# Patient Record
Sex: Female | Born: 1945 | Race: White | Hispanic: No | Marital: Married | Smoking: Never smoker
Health system: Southern US, Community
[De-identification: ages and names within clinical notes are randomized; demographics above are authoritative.]

## PROBLEM LIST (undated history)

## (undated) DIAGNOSIS — Z9889 Other specified postprocedural states: Secondary | ICD-10-CM

## (undated) DIAGNOSIS — E079 Disorder of thyroid, unspecified: Secondary | ICD-10-CM

## (undated) DIAGNOSIS — E039 Hypothyroidism, unspecified: Secondary | ICD-10-CM

## (undated) DIAGNOSIS — R112 Nausea with vomiting, unspecified: Secondary | ICD-10-CM

## (undated) HISTORY — PX: OTHER SURGICAL HISTORY: SHX169

## (undated) HISTORY — PX: ABDOMINAL HYSTERECTOMY: SHX81

## (undated) HISTORY — PX: APPENDECTOMY: SHX54

## (undated) HISTORY — PX: CHOLECYSTECTOMY: SHX55

---

## 2006-12-08 ENCOUNTER — Ambulatory Visit: Payer: Self-pay | Admitting: Otolaryngology

## 2006-12-30 ENCOUNTER — Ambulatory Visit: Payer: Self-pay | Admitting: Ophthalmology

## 2007-02-24 ENCOUNTER — Ambulatory Visit: Payer: Self-pay | Admitting: Ophthalmology

## 2017-11-06 LAB — LIPID PANEL
Cholesterol: 236 — AB (ref 0–200)
HDL: 66 (ref 35–70)
LDL Cholesterol: 143
LDl/HDL Ratio: 3.6
Triglycerides: 144 (ref 40–160)

## 2017-11-06 LAB — COMPREHENSIVE METABOLIC PANEL: GFR calc non Af Amer: 60

## 2017-11-06 LAB — BASIC METABOLIC PANEL: Creatinine: 0.9 (ref <=1.1)

## 2017-11-23 LAB — HM MAMMOGRAPHY

## 2017-12-07 LAB — HM DEXA SCAN

## 2019-12-27 ENCOUNTER — Telehealth: Payer: Self-pay

## 2019-12-27 ENCOUNTER — Other Ambulatory Visit: Payer: Self-pay

## 2019-12-27 ENCOUNTER — Ambulatory Visit (INDEPENDENT_AMBULATORY_CARE_PROVIDER_SITE_OTHER): Payer: Medicare PPO | Admitting: Family Medicine

## 2019-12-27 ENCOUNTER — Encounter: Payer: Self-pay | Admitting: Family Medicine

## 2019-12-27 VITALS — BP 150/60 | HR 71 | Temp 97.0°F | Ht 63.25 in | Wt 184.5 lb

## 2019-12-27 DIAGNOSIS — E039 Hypothyroidism, unspecified: Secondary | ICD-10-CM | POA: Diagnosis not present

## 2019-12-27 DIAGNOSIS — M791 Myalgia, unspecified site: Secondary | ICD-10-CM | POA: Diagnosis not present

## 2019-12-27 DIAGNOSIS — E782 Mixed hyperlipidemia: Secondary | ICD-10-CM

## 2019-12-27 DIAGNOSIS — M170 Bilateral primary osteoarthritis of knee: Secondary | ICD-10-CM

## 2019-12-27 DIAGNOSIS — R030 Elevated blood-pressure reading, without diagnosis of hypertension: Secondary | ICD-10-CM | POA: Diagnosis not present

## 2019-12-27 DIAGNOSIS — E785 Hyperlipidemia, unspecified: Secondary | ICD-10-CM | POA: Insufficient documentation

## 2019-12-27 DIAGNOSIS — T466X5A Adverse effect of antihyperlipidemic and antiarteriosclerotic drugs, initial encounter: Secondary | ICD-10-CM

## 2019-12-27 DIAGNOSIS — H93291 Other abnormal auditory perceptions, right ear: Secondary | ICD-10-CM

## 2019-12-27 NOTE — Assessment & Plan Note (Signed)
Hx of hylaurinc acid injections and would be interested in repeat if able. Encouraged tumeric and PT - she will meet with Dr. Patsy Lager and discuss with him. Requesting ortho records

## 2019-12-27 NOTE — Assessment & Plan Note (Addendum)
Feels weight issues may be related to thyroid but reports normal labs and due for annual level. Cont synthroid for now

## 2019-12-27 NOTE — Telephone Encounter (Signed)
[facility] Night - Client Nonclinical Telephone Record AccessNurse Client  Primary Care [facility] Night - Client Client Site  [facility] - Night Contact Type Call Who Is Calling Patient / Member / Family / Caregiver Caller Name Kelle Ruppert Caller Phone Number [phone] Patient Name Leily Capek Patient DOB 1945/09/26 Call Type Message Only Information Provided Reason for Call Request for General Office Information Initial Comment Caller states they are calling to confirm their appt. Additional Comment Disp. Time Disposition Final User 12/26/2019 5:38:51 PM General Information Provided Yes Lynnell Grain Call Closed By: Lynnell Grain Transaction Date/Time: 12/26/2019 5:36:57 PM (ET)

## 2019-12-27 NOTE — Assessment & Plan Note (Signed)
Pt notes hearing an echo and decreased hearing as well as occasional wind blowing sensation. Recommended to see ENT but never went. Encouraged follow-up for further evaluation. Referral placed

## 2019-12-27 NOTE — Progress Notes (Signed)
Subjective:     Amber Chavez is a 74 y.o. 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  #right ear - will get an echo sensation or wind blowing sensation - went to a audiologist - and told she had some hearing loss - and referred to the ENT   #hypothyroidism - always feels like it should be higher - and her dose was lowered previously - weight is the reason she thinks its off  Review of Systems   Social History   Tobacco Use  Smoking Status Never Smoker  Smokeless Tobacco Never Used        Objective:    BP Readings from Last 3 Encounters:  12/27/19 (!) 150/60   Wt Readings from Last 3 Encounters:  12/27/19 184 lb 8 oz (83.7 kg)    BP (!) 150/60   Pulse 71   Temp (!) 97 F (36.1 C) (Temporal)   Ht 5' 3.25" (1.607 m)   Wt 184 lb 8 oz (83.7 kg)   SpO2 96%   BMI 32.42 kg/m    Physical Exam Constitutional:      General: She is not in acute distress.    Appearance: She is well-developed. She is not diaphoretic.  HENT:     Right Ear: External ear normal.     Left Ear: External ear normal.     Nose: Nose normal.  Eyes:     Conjunctiva/sclera: Conjunctivae normal.  Cardiovascular:     Rate and Rhythm: Normal rate and regular rhythm.     Heart sounds: No murmur heard.   Pulmonary:     Effort: Pulmonary effort is normal. No respiratory distress.     Breath sounds: Normal breath sounds. No wheezing.  Musculoskeletal:     Cervical back: Neck supple.  Skin:    General: Skin is warm and dry.     Capillary Refill: Capillary refill takes less than 2 seconds.  Neurological:     Mental Status: She is alert. Mental status is at baseline.  Psychiatric:        Mood and  Affect: Mood normal.        Behavior: Behavior normal.           Assessment & Plan:   Problem List Items Addressed This Visit      Endocrine   Hypothyroidism    Feels weight issues may be related to thyroid but reports normal labs and due for annual level. Cont synthroid for now      Relevant Medications   levothyroxine (SYNTHROID) 88 MCG tablet   Other Relevant Orders   TSH   Comprehensive metabolic panel     Nervous and Auditory   Abnormal auditory perception of right ear    Pt notes hearing an echo and decreased hearing as well as occasional wind blowing sensation. Recommended to see ENT but never went. Encouraged follow-up for further evaluation. Referral placed      Relevant Orders   Ambulatory referral to ENT     Musculoskeletal and Integument   Osteoarthritis of both knees - Primary    Hx of hylaurinc acid injections and would be interested in repeat if  able. Encouraged tumeric and PT - she will meet with Dr. Patsy Lager and discuss with him. Requesting ortho records        Other   Myalgia due to statin    Statin induced muscle pain so declines      Hyperlipidemia    Recheck cholesterol. Encourage exercise and healthy diet      Relevant Orders   Lipid panel    Other Visit Diagnoses    Elevated blood pressure reading       Relevant Orders   CBC       Return in about 3 months (around 03/28/2020) for blood pressure.  Lynnda Child, MD  This visit occurred during the SARS-CoV-2 public health emergency.  Safety protocols were in place, including screening questions prior to the visit, additional usage of staff PPE, and extensive cleaning of exam room while observing appropriate contact time as indicated for disinfecting solutions.

## 2019-12-27 NOTE — Assessment & Plan Note (Signed)
Recheck cholesterol. Encourage exercise and healthy diet

## 2019-12-27 NOTE — Assessment & Plan Note (Signed)
Statin induced muscle pain so declines

## 2019-12-27 NOTE — Patient Instructions (Addendum)
Make appointment to see Dr. Patsy Lager for knee injection and care for knees   Curcumin or Tumeric  Research has shown that Curcumin (Tumeric) supplements are just as good as high dose anti-inflammatory medications (like diclofenac and ibuprofen) at treating pain from osteoarthritis without the side effects.   Take Curcumin 500 mg Three times daily   -- You can find a bottle at [employer] for $8 which should last a month -- [place] [employer] is around $9 and is available at [facility]  #Referral I have placed a referral to a specialist for you. You should receive a phone call from the specialty office. Make sure your voicemail is not full and that if you are able to answer your phone to unknown or new numbers.   It may take up to 2 weeks to hear about the referral. If you do not hear anything in 2 weeks, please call our office and ask to speak with the referral coordinator.   Your blood pressure high.   High blood pressure increases your risk for heart attack and stroke.    Please check your blood pressure 2-4 times a week.   To check your blood pressure 1) Sit in a quiet and relaxed place for 5 minutes 2) Make sure your feet are flat on the ground 3) Consider checking first thing in the morning   Normal blood pressure is less than 140/90 Ideally you blood pressure should be around 120/80  Other ways you can reduce your blood pressure:  1) Regular exercise -- Try to get 150 minutes (30 minutes, 5 days a week) of moderate to vigorous aerobic excercise -- Examples: brisk walking (2.5 miles per hour), water aerobics, dancing, gardening, tennis, biking slower than 10 miles per hour 2) DASH Diet - low fat meats, more fresh fruits and vegetables, whole grains, low salt 3) Quit smoking if you smoke 4) Loose 5-10% of your body weight

## 2019-12-28 ENCOUNTER — Other Ambulatory Visit: Payer: Self-pay | Admitting: Family Medicine

## 2019-12-28 ENCOUNTER — Telehealth: Payer: Self-pay

## 2019-12-28 DIAGNOSIS — E039 Hypothyroidism, unspecified: Secondary | ICD-10-CM

## 2019-12-28 DIAGNOSIS — H93291 Other abnormal auditory perceptions, right ear: Secondary | ICD-10-CM

## 2019-12-28 LAB — COMPREHENSIVE METABOLIC PANEL
ALT: 13 U/L (ref 0–35)
AST: 18 U/L (ref 0–37)
Albumin: 4.5 g/dL (ref 3.5–5.2)
Alkaline Phosphatase: 81 U/L (ref 39–117)
BUN: 18 mg/dL (ref 6–23)
CO2: 28 mEq/L (ref 19–32)
Calcium: 10 mg/dL (ref 8.4–10.5)
Chloride: 103 mEq/L (ref 96–112)
Creatinine, Ser: 0.9 mg/dL (ref 0.40–1.20)
GFR: 60.8 mL/min (ref >60.00)
Glucose, Bld: 106 mg/dL — ABNORMAL HIGH (ref 70–99)
Potassium: 4.3 mEq/L (ref 3.5–5.1)
Sodium: 140 mEq/L (ref 135–145)
Total Bilirubin: 0.5 mg/dL (ref 0.2–1.2)
Total Protein: 6.9 g/dL (ref 6.0–8.3)

## 2019-12-28 LAB — CBC
HCT: 39.4 % (ref 36.0–46.0)
Hemoglobin: 13.4 g/dL (ref 12.0–15.0)
MCHC: 34 g/dL (ref 30.0–36.0)
MCV: 89.7 fl (ref 78.0–100.0)
Platelets: 286 10*3/uL (ref 150.0–400.0)
RBC: 4.39 Mil/uL (ref 3.87–5.11)
RDW: 14.3 % (ref 11.5–15.5)
WBC: 4.4 10*3/uL (ref 4.0–10.5)

## 2019-12-28 LAB — LIPID PANEL
Cholesterol: 253 mg/dL — ABNORMAL HIGH (ref 0–200)
HDL: 74.8 mg/dL (ref >39.00)
LDL Cholesterol: 143 mg/dL — ABNORMAL HIGH (ref 0–99)
NonHDL: 178.38
Total CHOL/HDL Ratio: 3
Triglycerides: 175 mg/dL — ABNORMAL HIGH (ref 0.0–149.0)
VLDL: 35 mg/dL (ref 0.0–40.0)

## 2019-12-28 LAB — TSH: TSH: 0.92 u[IU]/mL (ref 0.35–4.50)

## 2019-12-28 MED ORDER — LEVOTHYROXINE SODIUM 88 MCG PO TABS: 88.0000 ug | ORAL_TABLET | Freq: Every day | ORAL | 3 refills | Status: DC

## 2019-12-28 NOTE — Telephone Encounter (Signed)
I spoke to pt and relayed lab results, per Dr. Selena Batten. Refill of Synthroid, sent to [facility] by Dr. Selena Batten, was canceled and resubmitted to [facility], per pt's request.

## 2019-12-29 ENCOUNTER — Encounter: Payer: Self-pay | Admitting: Family Medicine

## 2019-12-29 ENCOUNTER — Telehealth: Payer: Self-pay | Admitting: Family Medicine

## 2019-12-29 DIAGNOSIS — M810 Age-related osteoporosis without current pathological fracture: Secondary | ICD-10-CM | POA: Insufficient documentation

## 2019-12-29 NOTE — Telephone Encounter (Signed)
Recv'd records from [facility] forwarded 46 pages to Dr. Gweneth Dimitri 7/15/21fbg

## 2020-01-02 ENCOUNTER — Encounter: Payer: Self-pay | Admitting: Family Medicine

## 2020-01-26 ENCOUNTER — Encounter: Payer: Self-pay | Admitting: Family Medicine

## 2020-01-26 ENCOUNTER — Ambulatory Visit (INDEPENDENT_AMBULATORY_CARE_PROVIDER_SITE_OTHER)
Admission: RE | Admit: 2020-01-26 | Discharge: 2020-01-26 | Disposition: A | Discharge status: Home / Self Care | Payer: Medicare PPO | Source: Ambulatory Visit | Attending: Family Medicine | Admitting: Family Medicine

## 2020-01-26 ENCOUNTER — Other Ambulatory Visit: Payer: Self-pay

## 2020-01-26 ENCOUNTER — Ambulatory Visit: Payer: Medicare PPO | Admitting: Family Medicine

## 2020-01-26 VITALS — BP 124/68 | HR 78 | Temp 96.8°F | Ht 63.25 in

## 2020-01-26 DIAGNOSIS — M25562 Pain in left knee: Secondary | ICD-10-CM

## 2020-01-26 DIAGNOSIS — G8929 Other chronic pain: Secondary | ICD-10-CM

## 2020-01-26 DIAGNOSIS — M1712 Unilateral primary osteoarthritis, left knee: Secondary | ICD-10-CM
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
## 2020-01-26 NOTE — Patient Instructions (Signed)
Bauerfeind Genutrain Knee brace

## 2020-01-29 ENCOUNTER — Encounter: Payer: Self-pay | Admitting: Family Medicine

## 2020-02-02 ENCOUNTER — Encounter: Payer: Self-pay | Admitting: Family Medicine

## 2020-03-29 ENCOUNTER — Ambulatory Visit (INDEPENDENT_AMBULATORY_CARE_PROVIDER_SITE_OTHER): Payer: Medicare PPO | Admitting: Family Medicine

## 2020-03-29 ENCOUNTER — Other Ambulatory Visit: Payer: Self-pay

## 2020-03-29 VITALS — BP 120/58 | HR 72 | Temp 97.2°F | Ht 63.0 in | Wt 184.2 lb

## 2020-03-29 DIAGNOSIS — M810 Age-related osteoporosis without current pathological fracture: Secondary | ICD-10-CM

## 2020-03-29 DIAGNOSIS — Z0000 Encounter for general adult medical examination without abnormal findings: Secondary | ICD-10-CM | POA: Diagnosis not present

## 2020-03-29 MED ORDER — BIMATOPROST 0.03 % EX SOLN: 5.0000 mL | Freq: Every day | CUTANEOUS | 0 refills | Status: DC

## 2020-03-29 NOTE — Progress Notes (Signed)
Subjective:   Amber Chavez is a 74 y.o. female who presents for Medicare Annual (Subsequent) preventive examination.  Review of Systems    ROS Cardiac Risk Factors include: advanced age (>25men, >28 women)     Objective:    Today's Vitals   03/29/20 0939  BP: (!) 120/58  Pulse: 72  Temp: (!) 97.2 F (36.2 C)  TempSrc: Temporal  SpO2: 97%  Weight: 184 lb 4 oz (83.6 kg)  Height: 5\' 3"  (1.6 m)   Body mass index is 32.64 kg/m.  Advanced Directives 03/29/2020  Does Patient Have a Medical Advance Directive? Yes  Type of Advance Directive Living will;Healthcare Power of Attorney  Does patient want to make changes to medical advance directive? No - Patient declined  Copy of Healthcare Power of Attorney in Chart? No - copy requested    Current Medications (verified) Outpatient Encounter Medications as of 03/29/2020  Medication Sig  . bimatoprost (LATISSE) 0.03 % ophthalmic solution Place 5 mLs into both eyes at bedtime. Place one drop on applicator and apply evenly along the skin of the upper eyelid at base of eyelashes once daily at bedtime; repeat procedure for second eye (use a clean applicator).  03/31/2020 levothyroxine (SYNTHROID) 88 MCG tablet Take 1 tablet (88 mcg total) by mouth daily.  . [DISCONTINUED] bimatoprost (LATISSE) 0.03 % ophthalmic solution Place into both eyes at bedtime. Place one drop on applicator and apply evenly along the skin of the upper eyelid at base of eyelashes once daily at bedtime; repeat procedure for second eye (use a clean applicator).   No facility-administered encounter medications on file as of 03/29/2020.    Allergies (verified) Codeine and Demerol [meperidine hcl]   History: No past medical history on file. Past Surgical History:  Procedure Laterality Date  . ABDOMINAL HYSTERECTOMY    . APPENDECTOMY    . CHOLECYSTECTOMY    . right foot surgery     bone removed, then revision   Family History  Problem Relation Age of Onset  .  Healthy Mother   . Dilated cardiomyopathy Father   . Dilated cardiomyopathy Sister   . Dilated cardiomyopathy Paternal Aunt   . Kidney cancer Sister    Social History   Socioeconomic History  . Marital status: Married    Spouse name: 03/31/2020  . Number of children: 4  . Years of education: high school  . Highest education level: Not on file  Occupational History  . Not on file  Tobacco Use  . Smoking status: Never Smoker  . Smokeless tobacco: Never Used  Vaping Use  . Vaping Use: Never used  Substance and Sexual Activity  . Alcohol use: Yes    Comment: very seldomly   . Drug use: Never  . Sexual activity: Yes    Birth control/protection: Surgical  Other Topics Concern  . 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 Difficulty of Paying Living Expenses: Not on file  Food Insecurity:   . Worried About Programme researcher, broadcasting/film/video in the Last Year: Not on file  . Ran Out of Food in the Last Year: Not on file  Transportation Needs:   . Lack of Transportation (Medical): Not on file  . Lack of Transportation (Non-Medical): Not on file  Physical Activity:   . Days of Exercise per Week: Not on file  . Minutes of Exercise per Session: Not on file  Stress:   . Feeling of Stress : Not on file  Social Connections:   . Frequency of Communication with Friends and Family: Not on file  . Frequency  of Social Gatherings with Friends and Family: Not on file  . Attends Religious Services: Not on file  . Active Member of Clubs or Organizations: Not on file  . Attends Banker Meetings: Not on file  . Marital Status: Not on file    Tobacco Counseling Counseling given: Not Answered   Clinical Intake:  Pre-visit preparation completed: Yes  Pain : No/denies pain     Nutritional Status: BMI > 30  Obese Nutritional Risks: None Diabetes: No  How often do you need to have someone help you when you read instructions, pamphlets, or other written materials from your doctor or pharmacy?: 1 - Never What is the last grade level you completed in school?: Some college  Diabetic? no  Interpreter Needed?: No      Activities of Daily Living In your present state of health, do you have any difficulty performing the following activities: 03/29/2020  Hearing? Y  Comment hearing aids  Vision? N  Difficulty concentrating or making decisions? N  Walking or climbing stairs? Y  Comment knee pain, has railings  Dressing or bathing? N  Doing errands, shopping? N  Preparing Food and eating ? N  Using the Toilet? N  In the past six months, have you accidently leaked urine? N  Do you have problems with loss of bowel control? N  Managing your Medications? N  Managing your Finances? N  Housekeeping or managing your Housekeeping? N  Some recent data might be hidden    Patient Care Team: Lynnda Child, MD as PCP - General (Family Medicine)  Indicate any recent Medical Services you may have received from other than [facility] providers in the past year (date may be approximate).     Assessment:   This is a routine wellness examination for [facility].  Hearing/Vision screen  Hearing Screening   125Hz  250Hz  500Hz  1000Hz  2000Hz  3000Hz  4000Hz  6000Hz  8000Hz   Right ear:           Left ear:           Comments: Pt wears hearing aides    Visual Acuity Screening   Right eye Left eye  Both eyes  Without correction: 20/30 20/25 20/20   With correction:       Dietary issues and exercise activities discussed: Current Exercise Habits: Home exercise routine, Type of exercise: walking, Intensity: Mild, Exercise limited by: orthopedic condition(s)  Goals    . Exercise 3x per week (30 min per time)     Getting Knee pain improved to be able to be more active      Depression Screen [facility] 2/9 Scores 03/29/2020 03/29/2020 12/27/2019  PHQ -  2 Score 0 0 0    Fall Risk Fall Risk  03/29/2020 03/29/2020 12/27/2019  Falls in the past year? 0 0 0  Number falls in past yr: 0 0 -  Injury with Fall? 0 - -  Risk for fall due to : Orthopedic patient - -  Follow up Falls evaluation completed - -    Cognitive Function:  Mini-Cog - 03/29/20 1006    Normal clock drawing test? no    How many words correct? 3          Discussed failed clock test. She did get numbers correct. Handout for memory and encouraged walking and cognitive games. She and her husband will monitor at home and we will do different memory test next year.   Immunizations Immunization History  Administered Date(s) Administered  . PFIZER SARS-COV-2 Vaccination 09/07/2019, 09/28/2019  . Tdap 06/16/2008    TDAP status: Due, Education has been provided regarding the importance of this vaccine. Advised may receive this vaccine at local pharmacy or Health Dept. Aware to provide a copy of the vaccination record if obtained from local pharmacy or Health Dept. Verbalized acceptance and understanding. Flu Vaccine status: Declined, Education has been provided regarding the importance of this vaccine but patient still declined. Advised may receive this vaccine at local pharmacy or Health Dept. Aware to provide a copy of the vaccination record if obtained from local pharmacy or Health Dept. Verbalized acceptance and understanding. Pneumococcal vaccine status: Up to date Covid-19 vaccine status: Completed vaccines  Qualifies for  Shingles Vaccine? Yes   Zostavax completed Yes   Shingrix Completed?: Yes  Screening Tests Health Maintenance  Topic Date Due  . Hepatitis C Screening  Never done  . TETANUS/TDAP  06/16/2018  . INFLUENZA VACCINE  09/13/2020 (Originally 01/15/2020)  . PNA vac Low Risk Adult (1 of 2 - PCV13) 03/29/2021 (Originally 07/12/2008)  . DEXA SCAN  Completed  . COVID-19 Vaccine  Completed    Health Maintenance  Health Maintenance Due  Topic Date Due  . Hepatitis C Screening  Never done  . TETANUS/TDAP  06/16/2018    Colorectal cancer screening: No longer required.  Mammogram status: No longer required.  Bone Density status: Completed 2019. Results reflect: Bone density results: OSTEOPOROSIS. Repeat every 2 years.  Lung Cancer Screening: (Low Dose CT Chest recommended if Age 10-80 years, 30 pack-year currently smoking OR have quit w/in 15years.) does not qualify.   Lung Cancer Screening Referral: n/a  Additional Screening:  Hepatitis C Screening: does qualify; Completed - not yet done, will get with next lab draw  Vision Screening: Recommended annual ophthalmology exams for early detection of glaucoma and other disorders of the eye.   Dental Screening: Recommended annual dental exams for proper oral hygiene  Community Resource Referral / Chronic Care Management: CRR required this visit?  No   CCM required this visit?  No      Plan:      Problem List Items Addressed This Visit      Musculoskeletal and Integument   Osteoporosis    Discussed with patient. She is taking Vit D and Calcium and is not interested in additional treatment at this time. Will consider f/u Dexa next year if she thinks she may want treatment if worsening. Continue weight bearing activity.        Other Visit Diagnoses    Encounter for Medicare annual wellness exam    -  Primary       I have personally reviewed and  noted the following in the patient's chart:   . Medical and social history . Use of  alcohol, tobacco or illicit drugs  . Current medications and supplements . Functional ability and status . Nutritional status . Physical activity . Advanced directives . List of other physicians . Hospitalizations, surgeries, and ER visits in previous 12 months . Vitals . Screenings to include cognitive, depression, and falls . Referrals and appointments  In addition, I have reviewed and discussed with patient certain preventive protocols, quality metrics, and best practice recommendations. A written personalized care plan for preventive services as well as general preventive health recommendations were provided to patient.     Lynnda Child, MD   03/29/2020

## 2020-03-29 NOTE — Patient Instructions (Addendum)
Go to the pharmacy for Tetanus    Memory Compensation Strategies  Try memory games - crossword puzzles  Walking or regular exercise   1. Use "WARM" strategy.  W= write it down  A= associate it  R= repeat it  M= make a mental note  2.   You can keep a Glass blower/designer.  Use a 3-ring notebook with sections for the following: calendar, important names and phone numbers,  medications, doctors' names/phone numbers, lists/reminders, and a section to journal what you did  each day.   3.    Use a calendar to write appointments down.  4.    Write yourself a schedule for the day.  This can be placed on the calendar or in a separate section of the Memory Notebook.  Keeping a  regular schedule can help memory.  5.    Use medication organizer with sections for each day or morning/evening pills.  You may need help loading it  6.    Keep a basket, or pegboard by the door.  Place items that you need to take out with you in the basket or on the pegboard.  You may also want to  include a message board for reminders.  7.    Use sticky notes.  Place sticky notes with reminders in a place where the task is performed.  For example: " turn off the  stove" placed by the stove, "lock the door" placed on the door at eye level, " take your medications" on  the bathroom mirror or by the place where you normally take your medications.  8.    Use alarms/timers.  Use while cooking to remind yourself to check on food or as a reminder to take your medicine, or as a  reminder to make a call, or as a reminder to perform another task, etc.

## 2020-03-29 NOTE — Assessment & Plan Note (Signed)
Discussed with patient. She is taking Vit D and Calcium and is not interested in additional treatment at this time. Will consider f/u Dexa next year if she thinks she may want treatment if worsening. Continue weight bearing activity.

## 2020-04-17 NOTE — Telephone Encounter (Signed)
Pt seen 12/27/19.

## 2020-04-24 DIAGNOSIS — M170 Bilateral primary osteoarthritis of knee: Secondary | ICD-10-CM | POA: Diagnosis not present

## 2020-05-09 DIAGNOSIS — M170 Bilateral primary osteoarthritis of knee: Secondary | ICD-10-CM | POA: Diagnosis not present

## 2020-05-16 DIAGNOSIS — M170 Bilateral primary osteoarthritis of knee: Secondary | ICD-10-CM | POA: Diagnosis not present

## 2020-05-23 DIAGNOSIS — M170 Bilateral primary osteoarthritis of knee: Secondary | ICD-10-CM | POA: Diagnosis not present

## 2020-06-07 ENCOUNTER — Other Ambulatory Visit: Payer: Self-pay

## 2020-06-07 MED ORDER — BIMATOPROST 0.03 % EX SOLN: 5.0000 mL | Freq: Every day | CUTANEOUS | 0 refills | Status: DC

## 2020-06-07 NOTE — Telephone Encounter (Signed)
Pt left v/m requesting refill Bimatoprost ophthalmic solution to [facility] [address].

## 2020-06-29 MED ORDER — BIMATOPROST 0.03 % EX SOLN: 5.0000 mL | Freq: Every day | CUTANEOUS | 0 refills | Status: AC

## 2020-06-29 NOTE — Addendum Note (Signed)
Addended by: Eual Fines on: 06/29/2020 03:33 PM   Modules accepted: Orders

## 2020-06-29 NOTE — Telephone Encounter (Signed)
Pharmacy is telling pt they do not have the rx done 06-07-20. I have sent it back in.

## 2020-11-07 ENCOUNTER — Other Ambulatory Visit: Payer: Self-pay | Admitting: Family Medicine

## 2020-11-07 DIAGNOSIS — E039 Hypothyroidism, unspecified: Secondary | ICD-10-CM

## 2021-02-07 DIAGNOSIS — M5033 Other cervical disc degeneration, cervicothoracic region: Secondary | ICD-10-CM | POA: Diagnosis not present

## 2021-02-07 DIAGNOSIS — M62830 Muscle spasm of back: Secondary | ICD-10-CM | POA: Diagnosis not present

## 2021-02-07 DIAGNOSIS — M9902 Segmental and somatic dysfunction of thoracic region: Secondary | ICD-10-CM | POA: Diagnosis not present

## 2021-02-07 DIAGNOSIS — M9901 Segmental and somatic dysfunction of cervical region: Secondary | ICD-10-CM | POA: Diagnosis not present

## 2021-02-11 DIAGNOSIS — M5033 Other cervical disc degeneration, cervicothoracic region: Secondary | ICD-10-CM | POA: Diagnosis not present

## 2021-02-11 DIAGNOSIS — M62830 Muscle spasm of back: Secondary | ICD-10-CM | POA: Diagnosis not present

## 2021-02-11 DIAGNOSIS — M9901 Segmental and somatic dysfunction of cervical region: Secondary | ICD-10-CM | POA: Diagnosis not present

## 2021-02-11 DIAGNOSIS — M9902 Segmental and somatic dysfunction of thoracic region: Secondary | ICD-10-CM | POA: Diagnosis not present

## 2021-02-14 DIAGNOSIS — M62830 Muscle spasm of back: Secondary | ICD-10-CM | POA: Diagnosis not present

## 2021-02-14 DIAGNOSIS — M9902 Segmental and somatic dysfunction of thoracic region: Secondary | ICD-10-CM | POA: Diagnosis not present

## 2021-02-14 DIAGNOSIS — M9901 Segmental and somatic dysfunction of cervical region: Secondary | ICD-10-CM | POA: Diagnosis not present

## 2021-02-14 DIAGNOSIS — M5033 Other cervical disc degeneration, cervicothoracic region: Secondary | ICD-10-CM | POA: Diagnosis not present

## 2021-04-06 ENCOUNTER — Emergency Department: Payer: Medicare PPO

## 2021-04-06 ENCOUNTER — Other Ambulatory Visit: Payer: Self-pay

## 2021-04-06 ENCOUNTER — Emergency Department
Admission: EM | Admit: 2021-04-06 | Discharge: 2021-04-06 | Disposition: A | Discharge status: Home / Self Care | Payer: Medicare PPO | Attending: Emergency Medicine | Admitting: Emergency Medicine

## 2021-04-06 ENCOUNTER — Encounter: Payer: Self-pay | Admitting: Emergency Medicine

## 2021-04-06 DIAGNOSIS — S0992XA Unspecified injury of nose, initial encounter: Secondary | ICD-10-CM | POA: Diagnosis present

## 2021-04-06 DIAGNOSIS — Z79899 Other long term (current) drug therapy: Secondary | ICD-10-CM | POA: Insufficient documentation

## 2021-04-06 DIAGNOSIS — S022XXB Fracture of nasal bones, initial encounter for open fracture: Secondary | ICD-10-CM | POA: Diagnosis not present

## 2021-04-06 DIAGNOSIS — M4312 Spondylolisthesis, cervical region: Secondary | ICD-10-CM | POA: Diagnosis not present

## 2021-04-06 DIAGNOSIS — W0110XA Fall on same level from slipping, tripping and stumbling with subsequent striking against unspecified object, initial encounter: Secondary | ICD-10-CM | POA: Insufficient documentation

## 2021-04-06 DIAGNOSIS — M25521 Pain in right elbow: Secondary | ICD-10-CM | POA: Diagnosis not present

## 2021-04-06 DIAGNOSIS — M79621 Pain in right upper arm: Secondary | ICD-10-CM | POA: Diagnosis not present

## 2021-04-06 DIAGNOSIS — E039 Hypothyroidism, unspecified: Secondary | ICD-10-CM | POA: Insufficient documentation

## 2021-04-06 DIAGNOSIS — M47812 Spondylosis without myelopathy or radiculopathy, cervical region: Secondary | ICD-10-CM | POA: Diagnosis not present

## 2021-04-06 DIAGNOSIS — M2578 Osteophyte, vertebrae: Secondary | ICD-10-CM | POA: Diagnosis not present

## 2021-04-06 DIAGNOSIS — S01511A Laceration without foreign body of lip, initial encounter: Secondary | ICD-10-CM | POA: Insufficient documentation

## 2021-04-06 DIAGNOSIS — M40204 Unspecified kyphosis, thoracic region: Secondary | ICD-10-CM | POA: Diagnosis not present

## 2021-04-06 DIAGNOSIS — Z9049 Acquired absence of other specified parts of digestive tract: Secondary | ICD-10-CM | POA: Diagnosis not present

## 2021-04-06 DIAGNOSIS — Y9289 Other specified places as the place of occurrence of the external cause: Secondary | ICD-10-CM | POA: Insufficient documentation

## 2021-04-06 DIAGNOSIS — R079 Chest pain, unspecified: Secondary | ICD-10-CM | POA: Diagnosis not present

## 2021-04-06 DIAGNOSIS — W19XXXA Unspecified fall, initial encounter: Secondary | ICD-10-CM

## 2021-04-06 DIAGNOSIS — S022XXA Fracture of nasal bones, initial encounter for closed fracture: Secondary | ICD-10-CM | POA: Diagnosis not present

## 2021-04-06 HISTORY — DX: Disorder of thyroid, unspecified: E07.9

## 2021-04-06 LAB — BASIC METABOLIC PANEL
Anion gap: 7 (ref 5–15)
BUN: 14 mg/dL (ref 8–23)
CO2: 28 mmol/L (ref 22–32)
Calcium: 9.2 mg/dL (ref 8.9–10.3)
Chloride: 107 mmol/L (ref 98–111)
Creatinine, Ser: 0.84 mg/dL (ref 0.44–1.00)
GFR, Estimated: >60 mL/min (ref >60)
Glucose, Bld: 121 mg/dL — ABNORMAL HIGH (ref 70–99)
Potassium: 3.8 mmol/L (ref 3.5–5.1)
Sodium: 142 mmol/L (ref 135–145)

## 2021-04-06 LAB — CBC
HCT: 40.2 % (ref 36.0–46.0)
Hemoglobin: 13.2 g/dL (ref 12.0–15.0)
MCH: 30.1 pg (ref 26.0–34.0)
MCHC: 32.8 g/dL (ref 30.0–36.0)
MCV: 91.8 fL (ref 80.0–100.0)
Platelets: 277 10*3/uL (ref 150–400)
RBC: 4.38 MIL/uL (ref 3.87–5.11)
RDW: 13.8 % (ref 11.5–15.5)
WBC: 4.5 10*3/uL (ref 4.0–10.5)
nRBC: 0 % (ref 0.0–0.2)

## 2021-04-06 LAB — TROPONIN I (HIGH SENSITIVITY): Troponin I (High Sensitivity): 9 ng/L (ref <18)

## 2021-04-06 MED ORDER — LIDOCAINE HCL (PF) 1 % IJ SOLN
5.0000 mL | Freq: Once | INTRAMUSCULAR | Status: AC
  Administered 2021-04-06: 5 mL
  Filled 2021-04-06: qty 5

## 2021-04-06 MED ORDER — CEPHALEXIN 500 MG PO CAPS: 500.0000 mg | ORAL_CAPSULE | Freq: Three times a day (TID) | ORAL | 0 refills | Status: AC

## 2021-04-06 MED ORDER — ACETAMINOPHEN 500 MG PO TABS
1000.0000 mg | ORAL_TABLET | Freq: Once | ORAL | Status: AC
  Administered 2021-04-06: 1000 mg via ORAL
  Filled 2021-04-06: qty 2

## 2021-04-06 MED ORDER — LIDOCAINE HCL URETHRAL/MUCOSAL 2 % EX GEL
1.0000 "application " | Freq: Once | CUTANEOUS | Status: AC
  Administered 2021-04-06: 1 via TOPICAL
  Filled 2021-04-06: qty 10

## 2021-04-06 NOTE — ED Notes (Signed)
D/C and new RX's discussed with pt, pt verbalized understanding. Husband also verbalized understanding. Pt ambulatory on D/C with steady gait, pt offered wheelchair but refused. Husband with pt on D/C.

## 2021-04-06 NOTE — ED Notes (Signed)
Wound care to R side of nose done with chlorhexidine and also lip lac cleaned as well.

## 2021-04-06 NOTE — Discharge Instructions (Addendum)
Use Tylenol as needed for the pain.  Take Keflex 1 pill 3 times a day to prevent any infection where you cut your nose.  Follow-up with Dr. Jenne Campus, ear nose and throat doctor,.  Give his office a call on Monday to arrange follow-up at the end of the week.  Let them know that he said he wanted to see you by the end of the week.  Keep the wounds clean and dry.  Do not put any ointment on them because that will dissolve the skin glue that we use.  Please return for any further problems or signs of infection.

## 2021-04-06 NOTE — ED Provider Notes (Signed)
[facility] Emergency Department Provider Note   ____________________________________________   Event Date/Time   First MD Initiated Contact with Patient 04/06/21 1025     (approximate)  I have reviewed the triage vital signs and the nursing notes.   HISTORY  Chief Complaint Facial Injury   HPI Amber Chavez is a 75 y.o. female patient reports she tripped on the deck and fell.  She hit her nose.  She is complaining of a lot of pain in the mid right arm and in the elbow.  She has an obvious nasal deformity.  She says her teeth hurt as well.  She thinks she may have passed out.  She does not have any neck pain.  She remembers tripping.        Past Medical History:  Diagnosis Date   Thyroid disease     Patient Active Problem List   Diagnosis Date Noted   Osteoporosis 12/29/2019   Osteoarthritis of both knees 12/27/2019   Hypothyroidism 12/27/2019   Myalgia due to statin 12/27/2019   Hyperlipidemia 12/27/2019   Abnormal auditory perception of right ear 12/27/2019    Past Surgical History:  Procedure Laterality Date   ABDOMINAL HYSTERECTOMY     APPENDECTOMY     CHOLECYSTECTOMY     right foot surgery     bone removed, then revision    Prior to Admission medications   Medication Sig Start Date End Date Taking? Authorizing Provider  cephALEXin (KEFLEX) 500 MG capsule Take 1 capsule (500 mg total) by mouth 3 (three) times daily for 10 days. 04/06/21 04/16/21 Yes Arnaldo Natal, MD  bimatoprost (LATISSE) 0.03 % ophthalmic solution Place 5 mLs into both eyes at bedtime. Place one drop on applicator and apply evenly along the skin of the upper eyelid at base of eyelashes once daily at bedtime; repeat procedure for second eye (use a clean applicator). 06/29/20   Lynnda Child, MD  levothyroxine (SYNTHROID) 88 MCG tablet TAKE 1 TABLET EVERY DAY 11/07/20   Lynnda Child, MD    Allergies Codeine and Demerol [meperidine hcl]  Family History   Problem Relation Age of Onset   Healthy Mother    Dilated cardiomyopathy Father    Dilated cardiomyopathy Sister    Dilated cardiomyopathy Paternal Aunt    Kidney cancer Sister     Social History Social History   Tobacco Use   Smoking status: Never   Smokeless tobacco: Never  Vaping Use   Vaping Use: Never used  Substance Use Topics   Alcohol use: Yes    Comment: very seldomly    Drug use: Never    Review of Systems  Constitutional: No fever/chills Eyes: No visual changes. ENT: No sore throat. Cardiovascular: Denies chest pain. Respiratory: Denies shortness of breath. Gastrointestinal: No abdominal pain.  No nausea, no vomiting.  No diarrhea.  No constipation. Genitourinary: Negative for dysuria. Musculoskeletal: Negative for back pain. Skin: Negative for rash. Neurological: Negative for headaches, focal weakness   ____________________________________________   PHYSICAL EXAM:  VITAL SIGNS: ED Triage Vitals  Enc Vitals Group     BP 04/06/21 0851 (!) 160/71     Pulse Rate 04/06/21 0851 72     Resp 04/06/21 0851 16     Temp 04/06/21 0851 97.9 F (36.6 C)     Temp Source 04/06/21 0851 Oral     SpO2 04/06/21 0851 95 %     Weight 04/06/21 0843 184 lb 4.9 oz (83.6 kg)  Height 04/06/21 0843 5\' 3"  (1.6 m)     Head Circumference --      Peak Flow --      Pain Score 04/06/21 0843 10     Pain Loc --      Pain Edu? --      Excl. in GC? --    Constitutional: Alert and oriented. Well appearing and in no acute distress. Eyes: Conjunctivae are normal. PER Head: Atraumatic except for obvious nasal fracture Nose: No congestion/rhinnorhea.  No nasal septal hematoma seen on exam.  There is a half a centimeter cut on the right side of nasal bridge.  This is a deeper skin flap that is closing itself. Mouth/Throat: Mucous membranes are moist.  Oropharynx non-erythematous.  There is a very superficial cut on the lower lip which does go through the vermilion border.  There  is also a superficial cut on the inside of the upper lip which will not need stitches.  Patient reports her teeth hurt but none of them are loose none of them appear to be fractured. Neck: No stridor.  No cervical spine tenderness to palpation. Cardiovascular: Normal rate, regular rhythm. Grossly normal heart sounds.  Good peripheral circulation. Respiratory: Normal respiratory effort.  No retractions. Lungs CTAB. Gastrointestinal: Soft and nontender. No distention. No abdominal bruits.  Musculoskeletal: No lower extremity tenderness nor edema.  There is an abrasion on the mid shaft ulna on the right forearm.  Patient complains of pain in the elbow when she tries to move it but there are no fractures or joint swelling seen on her or on the x-ray.  Patient complains of pain at the insertion of the deltoid onto the humerus or that area.  There is no sign of fracture there is minimal bruising present. Neurologic:  Normal speech and language. No gross focal neurologic deficits are appreciated. No gait instability. Skin:  Skin is warm, dry and intact except for as noted. No rash noted.   ____________________________________________   LABS (all labs ordered are listed, but only abnormal results are displayed)  Labs Reviewed  BASIC METABOLIC PANEL - Abnormal; Notable for the following components:      Result Value   Glucose, Bld 121 (*)    All other components within normal limits  CBC  TROPONIN I (HIGH SENSITIVITY)  TROPONIN I (HIGH SENSITIVITY)   ____________________________________________  EKG   ____________________________________________  RADIOLOGY 04/08/21, personally viewed and evaluated these images (plain radiographs) as part of my medical decision making, as well as reviewing the written report by the radiologist.  ED MD interpretation: Elbow and humerus x-rays reviewed by me are negative for fracture CT of the head neck and face read by radiology reviewed by me  showed a nasal fracture only.  I reviewed the CT of the maxillofacial area with ENT as well who does not think that there is anything to worry about and does not believe that there is a nasal septal hematoma present there.  Chest x-ray also reviewed by me and read by radiology is negative.  Official radiology report(s): DG Chest 2 View  Result Date: 04/06/2021 CLINICAL DATA:  75 year female status post slip and fall.  Pain. EXAM: CHEST - 2 VIEW COMPARISON:  None. FINDINGS: Cardiac size at the upper limits of normal. Other mediastinal contours are within normal limits. Visualized tracheal air column is within normal limits. Mild elevation of the right hemidiaphragm appears to be normal variant. Lung volumes within normal limits. Mild coarse  background pulmonary interstitial markings appear to be chronic. No pneumothorax, pulmonary edema, pleural effusion or confluent pulmonary opacity. Mildly exaggerated thoracic kyphosis. Thoracic endplate spurring. Osteopenia. No acute osseous abnormality identified. Cholecystectomy clips in the right upper quadrant. Negative visible bowel gas pattern. IMPRESSION: No acute cardiopulmonary abnormality or acute traumatic injury identified. Electronically Signed   By: Odessa Fleming M.D.   On: 04/06/2021 09:45   DG Elbow 2 Views Right  Result Date: 04/06/2021 CLINICAL DATA:  Fall with pain EXAM: RIGHT ELBOW - 2 VIEW COMPARISON:  Humerus radiographs dated 04/06/2021. FINDINGS: There is no evidence of fracture, dislocation, or joint effusion. There is no evidence of arthropathy or other focal bone abnormality. Soft tissues are unremarkable. IMPRESSION: Negative. Electronically Signed   By: Romona Curls M.D.   On: 04/06/2021 12:28   CT HEAD WO CONTRAST ( )  Result Date: 04/06/2021 CLINICAL DATA:  75 year old female status post fall with pain. EXAM: CT HEAD WITHOUT CONTRAST TECHNIQUE: Contiguous axial images were obtained from the base of the skull through the vertex without  intravenous contrast. COMPARISON:  Brain MRI 12/08/2006. FINDINGS: Brain: Cerebral volume is not significantly changed. No midline shift, ventriculomegaly, mass effect, evidence of mass lesion, intracranial hemorrhage or evidence of cortically based acute infarction. Gray-white matter differentiation is within normal limits throughout the brain. Vascular: Calcified atherosclerosis at the skull base. No suspicious intracranial vascular hyperdensity. Skull: Bilateral nasal bone fractures, see face CT reported separately. Chronic posterior cranioplasty, was present in 2008. No acute skull fracture identified. Sinuses/Orbits: Chronic right maxillary sinus retention cyst is stable since 2008. Other mild right frontal and ethmoid sinus mucosal thickening appears inflammatory. No sinus fluid levels. Tympanic cavities and mastoids are clear. Other: No acute orbit or scalp soft tissue injury identified. IMPRESSION: 1. Bilateral nasal bone fractures, see Face CT reported separately. 2. Negative for age non contrast CT appearance of the brain. 3. Chronic posterior cranioplasty. Electronically Signed   By: Odessa Fleming M.D.   On: 04/06/2021 09:54   CT Cervical Spine Wo Contrast  Result Date: 04/06/2021 CLINICAL DATA:  75 year old female status post fall with pain. EXAM: CT CERVICAL SPINE WITHOUT CONTRAST TECHNIQUE: Multidetector CT imaging of the cervical spine was performed without intravenous contrast. Multiplanar CT image reconstructions were also generated. COMPARISON:  CT head and face today reported separately. FINDINGS: Alignment: Straightening of cervical lordosis with mild degenerative appearing anterolisthesis of C3 on C4. Cervicothoracic junction alignment is within normal limits. Bilateral posterior element alignment is within normal limits. Skull base and vertebrae: Visualized skull base is intact. No atlanto-occipital dissociation. C1 and C2 appear intact and aligned. No acute osseous abnormality identified. Soft  tissues and spinal canal: No prevertebral fluid or swelling. No visible canal hematoma. Negative visible noncontrast neck soft tissues. Disc levels: Chronic severe anterior C1-C2 degeneration. Widespread cervical disc space loss. There is chronic ankylosis of the left C4-C5 facets. Adjacent level C3-C4 facet arthropathy with mild associated anterolisthesis. But no significant spinal stenosis suspected. Upper chest: Visible upper thoracic levels appear intact. Negative lung apices. Diminutive thyroid. IMPRESSION: 1. No acute traumatic injury identified in the cervical spine. 2. 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 Electronically Signed   By: Odessa Fleming M.D.   On: 04/06/2021 10:00   DG Humerus Right  Result Date: 04/06/2021 CLINICAL DATA:  75 year old female status post fall with pain. EXAM: RIGHT HUMERUS - 2+ VIEW COMPARISON:  Chest radiographs today. FINDINGS: Bone mineralization is within normal limits.  Alignment at the right shoulder and elbow appears preserved. There is no evidence of fracture or other focal bone lesions. = negative visible right chest. IMPRESSION: No acute fracture or dislocation identified about the right humerus. Electronically Signed   By: Odessa Fleming M.D.   On: 04/06/2021 09:51   CT MAXILLOFACIAL WO CONTRAST  Result Date: 04/06/2021 CLINICAL DATA:  75 year old female status post fall with pain. EXAM: CT MAXILLOFACIAL WITHOUT CONTRAST TECHNIQUE: Multidetector CT imaging of the maxillofacial structures was performed. Multiplanar CT image reconstructions were also generated. COMPARISON:  Head and cervical spine CT today. FINDINGS: Osseous: Mandible intact and normally located. Acute bilateral nasal bone fractures, more displaced on the right (series 3, image 32). Mild right nasal soft tissue swelling also. Bilateral maxilla nasal process remain intact. No superimposed maxilla fracture.  Bilateral pterygoid and zygoma are intact. Orbits: No orbital wall fracture. Postoperative changes to both globes. Other orbits soft tissues appear normal. Sinuses: Mildly heterogeneous thickening of the anterior nasal septum on series 2, image 33 is suspicious for posttraumatic septal hematoma. Inflammatory appearing mucosal thickening and opacification of the right frontoethmoidal recess and ethmoids. Small chronic right maxillary mucous retention cysts. No hemorrhage layering within the sinuses. Tympanic cavities and mastoids are clear. Soft tissues: Mild right forehead scalp hematoma or contusion suspected on series 2, image 12. Right nasal soft tissue injury as above. No other superficial soft tissue injury identified. Negative visible noncontrast deep soft tissue spaces of face. Limited intracranial: Reported separately. IMPRESSION: 1. Bilateral nasal bone fractures, more displaced on the right with overlying soft tissue swelling on that side. 2. Possible associated traumatic anterior septal hematoma. 3. Mild right forehead scalp hematoma or contusion. 4. No other facial fracture. Electronically Signed   By: Odessa Fleming M.D.   On: 04/06/2021 09:58    ____________________________________________   PROCEDURES  Procedure(s) performed (including Critical Care):  Procedures   ____________________________________________   INITIAL IMPRESSION / ASSESSMENT AND PLAN / ED COURSE  Greig Right, PA came back to help me with the nose and skin glued the nose and the area around the vermilion border for me as I was busy with a number of other very ill patients and combative psychiatric patients.  We will discharge the patient.  Dr. Jenne Campus will follow up with a nasal fracture in his office as he and I discussed.  Patient will return for any further problems.  We will give her some Keflex prophylaxis since the nasal fracture could be considered open.               ____________________________________________   FINAL CLINICAL IMPRESSION(S) / ED DIAGNOSES  Final diagnoses:  Fall, initial encounter  Open fracture of nasal bone, initial encounter  Lip laceration, initial encounter     ED Discharge Orders          Ordered    cephALEXin (KEFLEX) 500 MG capsule  3 times daily        04/06/21 1212             Note:  This document was prepared using Dragon voice recognition software and may include unintentional dictation errors.    Arnaldo Natal, MD 04/06/21 [phone]

## 2021-04-06 NOTE — ED Provider Notes (Signed)
LACERATION REPAIR Performed by: Faythe Ghee Authorized by: Faythe Ghee Consent: Verbal consent obtained. Risks and benefits: risks, benefits and alternatives were discussed Consent given by: patient Patient identity confirmed: provided demographic data Prepped and Draped in normal sterile fashion Wound explored  Laceration Location: Nose  Laceration Length: 1 cm  No Foreign Bodies seen or palpated  Anesthesia: local infiltration  Local anesthetic: None  Anesthetic total: None  Irrigation method: syringe Amount of cleaning: standard  Skin closure: Dermabond  Number of sutures: N/A  Technique: An  Patient tolerance: Patient tolerated the procedure well with no immediate complications.    Faythe Ghee, PA-C 04/06/21 1214    Arnaldo Natal, MD 05/02/21 [phone]

## 2021-04-06 NOTE — ED Notes (Signed)
Wound care to R posterior forearm then wrapped in kurlex, wound care instructions given to pt.

## 2021-04-06 NOTE — ED Notes (Addendum)
Pt presents to ED with c/o of having a fall/trip this morning while outside on the deck taking care of her daughters dog. Pt is unsure of LOC. Pt does present with a deformed nose from baseline per pt and husband, bleeding controlled. Pt c/o of of R shoulder pain no deformity noted to this extremity, pt has R radial pulse. Pt denies blood thinners. Pt denies neck pain at this time. Pt does have small lac to R side of bottom lip.   Pt went to imaging before being roomed to RM 1. NAD noted at this time.   Pt denies dizziness or chest pain prior to fall. Pt is A&Ox4.

## 2021-04-06 NOTE — ED Provider Notes (Signed)
LACERATION REPAIR Performed by: Faythe Ghee Authorized by: Faythe Ghee Consent: Verbal consent obtained. Risks and benefits: risks, benefits and alternatives were discussed Consent given by: patient Patient identity confirmed: provided demographic data Prepped and Draped in normal sterile fashion Wound explored  Laceration Location: Lower lip  Laceration Length: 0.5 cm  No Foreign Bodies seen or palpated  Anesthesia: local infiltration  Local anesthetic: LET  Anesthetic total: na  Irrigation method: syringe Amount of cleaning: standard  Skin closure: Dermabond  Number of sutures: N/A  Technique: N/A  Patient tolerance: Patient tolerated the procedure well with no immediate complications.    Faythe Ghee, PA-C 04/06/21 1213    Arnaldo Natal, MD 04/06/21 [phone]

## 2021-04-06 NOTE — ED Triage Notes (Signed)
Slipped this morning on deck. Fell forward.  Nose injury and c/o right arm pain.  No LOC, but per spouse, patient was dazed after fall.  Swelling seen to bridge of nose and right upper arm pain.

## 2021-04-11 DIAGNOSIS — H6121 Impacted cerumen, right ear: Secondary | ICD-10-CM | POA: Diagnosis not present

## 2021-04-11 DIAGNOSIS — S022XXA Fracture of nasal bones, initial encounter for closed fracture: Secondary | ICD-10-CM | POA: Diagnosis not present

## 2021-04-15 ENCOUNTER — Encounter: Payer: Self-pay | Admitting: Unknown Physician Specialty

## 2021-04-19 ENCOUNTER — Encounter
Admission: RE | Disposition: A | Discharge status: Home / Self Care | Payer: Self-pay | Source: Ambulatory Visit | Attending: Unknown Physician Specialty

## 2021-04-19 ENCOUNTER — Other Ambulatory Visit: Payer: Self-pay

## 2021-04-19 ENCOUNTER — Ambulatory Visit: Payer: Medicare PPO | Admitting: Anesthesiology

## 2021-04-19 ENCOUNTER — Encounter: Payer: Self-pay | Admitting: Unknown Physician Specialty

## 2021-04-19 ENCOUNTER — Ambulatory Visit
Admission: RE | Admit: 2021-04-19 | Discharge: 2021-04-19 | Disposition: A | Discharge status: Home / Self Care | Payer: Medicare PPO | Source: Ambulatory Visit | Attending: Unknown Physician Specialty | Admitting: Unknown Physician Specialty

## 2021-04-19 DIAGNOSIS — W19XXXA Unspecified fall, initial encounter: Secondary | ICD-10-CM | POA: Insufficient documentation

## 2021-04-19 DIAGNOSIS — Z888 Allergy status to other drugs, medicaments and biological substances status: Secondary | ICD-10-CM | POA: Diagnosis not present

## 2021-04-19 DIAGNOSIS — S022XXA Fracture of nasal bones, initial encounter for closed fracture: Secondary | ICD-10-CM | POA: Diagnosis not present

## 2021-04-19 DIAGNOSIS — Z91041 Radiographic dye allergy status: Secondary | ICD-10-CM | POA: Insufficient documentation

## 2021-04-19 DIAGNOSIS — Z885 Allergy status to narcotic agent status: Secondary | ICD-10-CM | POA: Insufficient documentation

## 2021-04-19 DIAGNOSIS — I10 Essential (primary) hypertension: Secondary | ICD-10-CM | POA: Insufficient documentation

## 2021-04-19 HISTORY — DX: Nausea with vomiting, unspecified: R11.2

## 2021-04-19 HISTORY — DX: Other specified postprocedural states: Z98.890

## 2021-04-19 HISTORY — PX: CLOSED REDUCTION NASAL FRACTURE: SHX5365

## 2021-04-19 HISTORY — DX: Hypothyroidism, unspecified: E03.9

## 2021-04-19 SURGERY — CLOSED REDUCTION, FRACTURE, NASAL BONE
Anesthesia: General | Site: Nose

## 2021-04-19 MED ORDER — LIDOCAINE HCL (CARDIAC) PF 100 MG/5ML IV SOSY
PREFILLED_SYRINGE | INTRAVENOUS | Status: DC | PRN
Start: 2021-04-19 — End: 2021-04-19
  Administered 2021-04-19: 50 mg via INTRATRACHEAL

## 2021-04-19 MED ORDER — ACETAMINOPHEN 10 MG/ML IV SOLN
1000.0000 mg | Freq: Once | INTRAVENOUS | Status: AC
  Administered 2021-04-19: 1000 mg via INTRAVENOUS

## 2021-04-19 MED ORDER — HYDROMORPHONE HCL 1 MG/ML IJ SOLN: 0.2500 mg | INTRAMUSCULAR | Status: DC | PRN

## 2021-04-19 MED ORDER — PROMETHAZINE HCL 25 MG/ML IJ SOLN: 6.2500 mg | INTRAMUSCULAR | Status: DC | PRN

## 2021-04-19 MED ORDER — LACTATED RINGERS IV SOLN: INTRAVENOUS | Status: DC

## 2021-04-19 MED ORDER — OXYCODONE HCL 5 MG PO TABS: 5.0000 mg | ORAL_TABLET | Freq: Once | ORAL | Status: DC | PRN

## 2021-04-19 MED ORDER — DEXAMETHASONE SODIUM PHOSPHATE 4 MG/ML IJ SOLN
INTRAMUSCULAR | Status: DC | PRN
  Administered 2021-04-19: 8 mg via INTRAVENOUS

## 2021-04-19 MED ORDER — HYDRALAZINE HCL 20 MG/ML IJ SOLN
10.0000 mg | Freq: Once | INTRAMUSCULAR | Status: AC
  Administered 2021-04-19: 10 mg via INTRAVENOUS

## 2021-04-19 MED ORDER — PHENYLEPHRINE HCL 0.5 % NA SOLN: NASAL | Status: DC | PRN

## 2021-04-19 MED ORDER — OXYMETAZOLINE HCL 0.05 % NA SOLN
6.0000 | Freq: Once | NASAL | Status: AC
  Administered 2021-04-19: 6 via NASAL

## 2021-04-19 MED ORDER — PROPOFOL 10 MG/ML IV BOLUS
INTRAVENOUS | Status: DC | PRN
  Administered 2021-04-19: 50 mg via INTRAVENOUS
  Administered 2021-04-19: 100 mg via INTRAVENOUS

## 2021-04-19 MED ORDER — PROPOFOL 500 MG/50ML IV EMUL
INTRAVENOUS | Status: DC | PRN
  Administered 2021-04-19: 50 ug/kg/min via INTRAVENOUS

## 2021-04-19 MED ORDER — OXYCODONE HCL 5 MG/5ML PO SOLN: 5.0000 mg | Freq: Once | ORAL | Status: DC | PRN

## 2021-04-19 MED ORDER — FENTANYL CITRATE (PF) 100 MCG/2ML IJ SOLN
INTRAMUSCULAR | Status: DC | PRN
  Administered 2021-04-19: 50 ug via INTRAVENOUS

## 2021-04-19 SURGICAL SUPPLY — 16 items
ADH LQ OCL WTPRF AMP STRL LF (MISCELLANEOUS) ×2
ADHESIVE MASTISOL STRL (MISCELLANEOUS) ×4 IMPLANT
BASIN GRAD PLASTIC 32OZ STRL (MISCELLANEOUS) ×2 IMPLANT
CANISTER SUCT 1200ML W/VALVE (MISCELLANEOUS) ×2 IMPLANT
COAG SUCT 10F 3.5MM HAND CTRL (MISCELLANEOUS) ×1 IMPLANT
COVER MAYO STAND STRL (DRAPES) ×2 IMPLANT
CUP MEDICINE 2OZ PLAST GRAD ST (MISCELLANEOUS) ×4 IMPLANT
GAUZE SPONGE 4X4 12PLY STRL (GAUZE/BANDAGES/DRESSINGS) ×2 IMPLANT
GLOVE SURG ENC MOIS LTX SZ7.5 (GLOVE) ×2 IMPLANT
SPONGE NEURO XRAY DETECT 1X3 (DISPOSABLE) ×2 IMPLANT
STRIP CLOSURE SKIN 1/4X4 (GAUZE/BANDAGES/DRESSINGS) ×2 IMPLANT
SUCTION FRAZIER HANDLE 10FR (MISCELLANEOUS) ×1
SUCTION TUBE FRAZIER 10FR DISP (MISCELLANEOUS) ×1 IMPLANT
TOWEL OR 17X26 4PK STRL BLUE (TOWEL DISPOSABLE) ×2 IMPLANT
TUBING CONNECTING 10 (TUBING) ×2 IMPLANT
WATER STERILE IRR 250ML POUR (IV SOLUTION) ×2 IMPLANT

## 2021-04-19 NOTE — Anesthesia Procedure Notes (Signed)
Procedure Name: LMA Insertion Date/Time: 04/19/2021 10:35 AM Performed by: Jimmy Picket, CRNA Pre-anesthesia Checklist: Patient identified, Emergency Drugs available, Suction available, Timeout performed and Patient being monitored Patient Re-evaluated:Patient Re-evaluated prior to induction Oxygen Delivery Method: Circle system utilized Preoxygenation: Pre-oxygenation with 100% oxygen Induction Type: IV induction LMA: LMA inserted LMA Size: 4.0 Number of attempts: 1 Placement Confirmation: positive ETCO2 and breath sounds checked- equal and bilateral Tube secured with: Tape

## 2021-04-19 NOTE — Transfer of Care (Signed)
Immediate Anesthesia Transfer of Care Note  Patient: Amber Chavez  Procedure(s) Performed: CLOSED REDUCTION NASAL FRACTURE (Nose)  Patient Location: PACU  Anesthesia Type: General  Level of Consciousness: awake, alert  and patient cooperative  Airway and Oxygen Therapy: Patient Spontanous Breathing and Patient connected to supplemental oxygen  Post-op Assessment: Post-op Vital signs reviewed, Patient's Cardiovascular Status Stable, Respiratory Function Stable, Patent Airway and No signs of Nausea or vomiting  Post-op Vital Signs: Reviewed and stable  Complications: No notable events documented.

## 2021-04-19 NOTE — Op Note (Signed)
04/19/2021  10:50 AM    Lynn Ito  [id number]   Pre-Op Dx: nasal fracture  Post-op Dx: SAME  Proc: Closed reduction nasal fracture, cautery of right intranasal bleeding  Surg:  Davina Poke  Anes:  GOT  EBL: Less than 10 cc  Comp: None  Findings: In fracture right nasal bone out fracture left nasal bone mild bleeding anterior tip middle turbinate on the right  Procedure: Gregg was identified in the holding area take the operating room placed in supine position.  After laryngeal mask anesthesia lamination nose showed an obvious infracture of the right nasal bone and outfracture of the left nasal bone.  There is also soft tissue swelling of the right upper portion of the nasal bone from previous laceration sustained during fall.  Cottonoid pledget with phenylephrine lidocaine solution was placed within each nostril.  After approximately 5 minutes these were removed.  The left nasal bone was manually manipulated back into position the right nasal bone was elevated back into position using a nasal elevator.  This nicely popped the bone back into anatomic position.  Examination nasal dorsum showed excellent realignment of the nasal bones there remained the soft tissue swelling from the previous laceration.  There was small bit of bleeding from the anterior tip of the middle turbinate suction cautery was used to cauterize this to prevent any further bleeding.  Cottonoid pledgets were replaced within each nostril Steri-Strips were placed across the dorsum of the nose and a Aquaplast cast which had been previously fashioned was heated and placed over the nasal dorsum.  Care was taken not to get any hot water in her eyes.  The cast was then affixed using plastic tape cottonoid pledgets were removed from each nostril there was no bleeding.  The patient was then returned to anesthesia where she was awakened in the operating room type cart in stable condition.  Dispo:   Good  Plan:  Discharged home follow-up 1 week  Davina Poke  04/19/2021 10:50 AM

## 2021-04-19 NOTE — H&P (Signed)
The patient's history has been reviewed, patient examined, no change in status, stable for surgery.  Questions were answered to the patients satisfaction.  

## 2021-04-19 NOTE — Anesthesia Postprocedure Evaluation (Signed)
Anesthesia Post Note  Patient: Amber Chavez  Procedure(s) Performed: CLOSED REDUCTION NASAL FRACTURE (Nose)     Patient location during evaluation: PACU Anesthesia Type: General Level of consciousness: awake and alert Pain management: pain level controlled Vital Signs Assessment: post-procedure vital signs reviewed and stable Respiratory status: spontaneous breathing, nonlabored ventilation and respiratory function stable Cardiovascular status: blood pressure returned to baseline and stable Postop Assessment: no apparent nausea or vomiting Anesthetic complications: no   No notable events documented.  Loni Beckwith

## 2021-04-19 NOTE — Anesthesia Preprocedure Evaluation (Signed)
Anesthesia Evaluation  Patient identified by MRN, date of birth, ID band Patient awake    Reviewed: Allergy & Precautions, H&P , NPO status , Patient's Chart, lab work & pertinent test results, reviewed documented beta blocker date and time   History of Anesthesia Complications (+) PONV and history of anesthetic complications  Airway Mallampati: II  TM Distance: >3 FB Neck ROM: full    Dental no notable dental hx.    Pulmonary neg pulmonary ROS,    Pulmonary exam normal breath sounds clear to auscultation       Cardiovascular Exercise Tolerance: Good hypertension, negative cardio ROS   Rhythm:regular Rate:Normal     Neuro/Psych negative neurological ROS  negative psych ROS   GI/Hepatic negative GI ROS, Neg liver ROS,   Endo/Other  negative endocrine ROSHypothyroidism   Renal/GU negative Renal ROS  negative genitourinary   Musculoskeletal   Abdominal   Peds  Hematology negative hematology ROS (+)   Anesthesia Other Findings   Reproductive/Obstetrics negative OB ROS                             Anesthesia Physical Anesthesia Plan  ASA: 2  Anesthesia Plan: General   Post-op Pain Management:    Induction:   PONV Risk Score and Plan: TIVA, Propofol infusion and Treatment may vary due to age or medical condition  Airway Management Planned:   Additional Equipment:   Intra-op Plan:   Post-operative Plan:   Informed Consent: I have reviewed the patients History and Physical, chart, labs and discussed the procedure including the risks, benefits and alternatives for the proposed anesthesia with the patient or authorized representative who has indicated his/her understanding and acceptance.     Dental Advisory Given  Plan Discussed with: CRNA  Anesthesia Plan Comments:         Anesthesia Quick Evaluation

## 2021-04-22 ENCOUNTER — Encounter: Payer: Self-pay | Admitting: Unknown Physician Specialty

## 2021-04-23 ENCOUNTER — Other Ambulatory Visit: Payer: Self-pay

## 2021-04-23 ENCOUNTER — Telehealth: Payer: Medicare PPO | Admitting: Family Medicine

## 2021-04-23 ENCOUNTER — Encounter: Payer: Self-pay | Admitting: Family Medicine

## 2021-04-23 DIAGNOSIS — M25511 Pain in right shoulder: Secondary | ICD-10-CM

## 2021-04-23 DIAGNOSIS — M79601 Pain in right arm: Secondary | ICD-10-CM | POA: Diagnosis not present

## 2021-04-23 NOTE — Progress Notes (Signed)
I connected with Amber Chavez on 04/23/21 at 10:40 AM EST by video and verified that I am speaking with the correct person using two identifiers.   I discussed the limitations, risks, security and privacy concerns of performing an evaluation and management service by video and the availability of in person appointments. I also discussed with the patient that there may be a patient responsible charge related to this service. The patient expressed understanding and agreed to proceed.  Patient location: Home Provider Location: [facility] Participants: Lynnda Child and Amber Chavez   Subjective:     Amber Chavez is a 75 y.o. female presenting for ER follow-up (Fall 04/06/21. Hurt R arm. Injury between shoulder and elbow. Has 50-60% ROM back. Will return to [place] this weekend. )     HPI  #Fall on 10/22 - fell on the right side of her arm/body - most on the shoulder  - difficulty moving the shoulder - did have a scrap along the elbow - kids are concerned about her shoulder - can lift to about 90 - is able to put her hand behind her head - ROM is improving  - is able to move her arm behind her back - can put her bra on  Fall was due to a trip injury - foot caught on the carpet - deck carpet   Review of Systems   Social History   Tobacco Use  Smoking Status Never  Smokeless Tobacco Never        Objective:   BP Readings from Last 3 Encounters:  04/19/21 (!) 163/65  04/06/21 (!) 134/105  03/29/20 (!) 120/58   Wt Readings from Last 3 Encounters:  04/19/21 181 lb (82.1 kg)  04/06/21 184 lb 4.9 oz (83.6 kg)  03/29/20 184 lb 4 oz (83.6 kg)   There were no vitals taken for this visit.  Physical Exam Constitutional:      Appearance: Normal appearance. She is not ill-appearing.  HENT:     Head: Normocephalic and atraumatic.     Right Ear: External ear normal.     Left Ear: External ear normal.  Eyes:     Conjunctiva/sclera:  Conjunctivae normal.  Pulmonary:     Effort: Pulmonary effort is normal. No respiratory distress.  Musculoskeletal:     Comments: Able to place right arm behind her back. Also able to place hand behind head. Lifts to approximately 90 degrees and she notes pain  Neurological:     Mental Status: She is alert. Mental status is at baseline.  Psychiatric:        Mood and Affect: Mood normal.        Behavior: Behavior normal.        Thought Content: Thought content normal.        Judgment: Judgment normal.            Assessment & Plan:   Problem List Items Addressed This Visit       Other   Acute pain of right shoulder - Primary    From mechanical fall. Notes pain more along the arm but difficulty lifting the shoulder beyond 90 degrees. Discussed it could be rotator cuff. Reviewed ER work-up including normal XR arm and elbow but shoulder film not done. Advised trial of PT and f/u with Dr. Patsy Lager (sports medicine) if worsening or not improving      Relevant Orders   Ambulatory referral to Physical Therapy   Other Visit Diagnoses  Right arm pain       Relevant Orders   Ambulatory referral to Physical Therapy        Return if symptoms worsen or fail to improve.  Lynnda Child, MD

## 2021-04-23 NOTE — Patient Instructions (Signed)
Shoulder pain - it could be a rotator cuff or even frozen shoulder - as we discussed I'd recommend physical therapy first  If no improvement or physical therapy is concerned - make an appointment with Dr. Patsy Lager at my office - he is sports medicine. He does injections and can determine the best imaging.   We try to avoid surgery for rotator cuff injuries in patients over 70 so hopefully therapy will improve your functioning.   #Referral I have placed a referral to a specialist for you. You should receive a phone call from the specialty office. Make sure your voicemail is not full and that if you are able to answer your phone to unknown or new numbers.   It may take up to 2 weeks to hear about the referral. If you do not hear anything in 2 weeks, please call our office and ask to speak with the referral coordinator.

## 2021-04-23 NOTE — Assessment & Plan Note (Signed)
From mechanical fall. Notes pain more along the arm but difficulty lifting the shoulder beyond 90 degrees. Discussed it could be rotator cuff. Reviewed ER work-up including normal XR arm and elbow but shoulder film not done. Advised trial of PT and f/u with Dr. Patsy Lager (sports medicine) if worsening or not improving

## 2021-05-03 ENCOUNTER — Encounter: Payer: Self-pay | Admitting: Unknown Physician Specialty

## 2021-05-03 NOTE — OR Nursing (Signed)
Entered chart to correct case start time.

## 2021-05-07 ENCOUNTER — Ambulatory Visit: Payer: Medicare PPO | Admitting: Internal Medicine

## 2021-05-17 ENCOUNTER — Other Ambulatory Visit: Payer: Self-pay

## 2021-05-17 ENCOUNTER — Ambulatory Visit: Payer: Medicare PPO | Attending: Family Medicine | Admitting: Physical Therapy

## 2021-05-17 ENCOUNTER — Encounter: Payer: Self-pay | Admitting: Physical Therapy

## 2021-05-17 DIAGNOSIS — M25511 Pain in right shoulder: Secondary | ICD-10-CM | POA: Insufficient documentation

## 2021-05-17 DIAGNOSIS — M25611 Stiffness of right shoulder, not elsewhere classified: Secondary | ICD-10-CM | POA: Insufficient documentation

## 2021-05-17 DIAGNOSIS — M79601 Pain in right arm: Secondary | ICD-10-CM | POA: Diagnosis not present

## 2021-05-17 DIAGNOSIS — M6281 Muscle weakness (generalized): Secondary | ICD-10-CM | POA: Insufficient documentation

## 2021-05-17 NOTE — Therapy (Signed)
[facility] MAIN [facility] SERVICES [address] [place], [place], [address] Phone: [phone]   Fax:  [phone]  Physical Therapy Evaluation  Patient Details  Name: Amber Chavez MRN: [id number] Date of Birth: 1945/12/22 Referring Provider (PT): Chryl Heck cody   Encounter Date: 05/17/2021   PT End of Session - 05/17/21 1122     Visit Number 1    Number of Visits 16    Date for PT Re-Evaluation 07/12/21    Progress Note Due on Visit 10    PT Start Time 1015    PT Stop Time 1114    PT Time Calculation (min) 59 min    Activity Tolerance Patient tolerated treatment well    Behavior During Therapy WFL for tasks assessed/performed             Past Medical History:  Diagnosis Date   Hypothyroidism    PONV (postoperative nausea and vomiting)    Thyroid disease     Past Surgical History:  Procedure Laterality Date   ABDOMINAL HYSTERECTOMY     APPENDECTOMY     CHOLECYSTECTOMY     CLOSED REDUCTION NASAL FRACTURE N/A 04/19/2021   Procedure: CLOSED REDUCTION NASAL FRACTURE;  Surgeon: Linus Salmons, MD;  Location: [facility];  Service: ENT;  Laterality: N/A;  keep last case   right foot surgery     bone removed, then revision    There were no vitals filed for this visit.    Subjective Assessment - 05/17/21 1021     Subjective Pt reports she fell forward and injured her shoulder. Reports most of the injury occured to the right shoulder. Pt reports not remembering much of event but does not remember any contorting of the shoulder. Pt reports no prior injury to the shoulder. Pt reports pain with the shoulder with lifting objects and holding weight in. 04/06/21 was the date of the fall. Pt reports no other lingering injuries from the fall just some lingering scars. Pt reports she and her husband will be moving to [place] on December 06/05/21.    Patient is accompained by: Family member    Pertinent History Pt reports she  fell forward and injured her shoulder. Reports most of the injury occured to the right shoulder. Pt reports not remembering much of event but does not remember any contorting of the shoulder. Pt reports no prior injury to the shoulder. Pt reports pain with the shoulder with lifting objects and holding weight in. 04/06/21 was the date of the fall. Pt reports no other lingering injuries from the fall just some lingering scars. Pt reports she and her husband will be moving to [place] on December 06/05/21.    Limitations House hold activities;Lifting    How long can you sit comfortably? N/A    How long can you stand comfortably? N/A    How long can you walk comfortably? n/A    Diagnostic tests no fractures via x-ray    Patient Stated Goals Improve R shoulder funciton and range of motion    Currently in Pain? Yes    Pain Score 4    pain with movement, noe resting   Pain Location Shoulder    Pain Orientation Right    Pain Descriptors / Indicators Aching;Sore    Pain Type Chronic pain    Pain Onset More than a month ago    Pain Frequency Intermittent    Aggravating Factors  movement, range of motion    Effect of  Pain on Daily Activities unable to perform overhead daily activites    Multiple Pain Sites No                [facility] PT Assessment - 05/17/21 0001       Assessment   Medical Diagnosis R Shoulder pain and loss of motion    Referring Provider (PT) Chryl Heck. cody    Hand Dominance Right    Prior Therapy None      Precautions   Precaution Comments None      Balance Screen   Has the patient fallen in the past 6 months Yes    How many times? 1    Has the patient had a decrease in activity level because of a fear of falling?  Yes    Is the patient reluctant to leave their home because of a fear of falling?  No      Prior Function   Level of Independence Independent      ROM / Strength   AROM / PROM / Strength AROM;PROM;Strength      AROM   AROM Assessment Site Shoulder     Right/Left Shoulder Right    Right Shoulder Flexion 70 Degrees (P)     Right Shoulder ABduction 57 Degrees (P)     Right Shoulder Internal Rotation -- (P)    L5   Right Shoulder External Rotation -- (P)    C7     PROM   PROM Assessment Site Shoulder    Right/Left Shoulder Right    Right Shoulder Flexion 97 Degrees (P)     Right Shoulder ABduction 107 Degrees (P)     Right Shoulder Internal Rotation 58 Degrees (P)     Right Shoulder External Rotation 55 Degrees (P)       Strength   Strength Assessment Site Shoulder    Right/Left Shoulder Right    Right Shoulder Flexion 3-/5 (P)     Right Shoulder ABduction 3-/5 (P)     Right Shoulder Internal Rotation 3+/5 (P)     Right Shoulder External Rotation 3+/5 (P)                         Objective measurements completed on examination: See above findings.    Exercise/Activity Sets x Reps Resistance Assistance  Comments  Shoulder table flexion  1 x 10 x 5 sec holds     Shoulder table ABD 1 x 3 x 5 sec     Table shoulder scaption 1 x 3 x 5 sec    Completed 3 to ensure proper form, complete in higher reps per instruction handout   Isometric ER, IR, flexion  1 x 5 x 5 sec   Completed 3 to ensure proper form, complete in higher reps per instruction handout                                                          PT Education - 05/17/21 1117     Education Details HEP, POC    Person(s) Educated Patient;Spouse    Methods Explanation;Handout;Demonstration;Tactile cues;Verbal cues    Comprehension Verbalized understanding;Returned demonstration              PT Short Term Goals - 05/17/21 1125  PT SHORT TERM GOAL #1   Title Patient will be independent in home exercise program to improve strength/mobility for better functional independence with ADLs.    Baseline Pt completing soe exercises from internet at home, no formal HEP    Time 4    Period Weeks    Status New    Target Date 06/14/21       PT SHORT TERM GOAL #2   Title Patient will increase FOTO score to equal to or greater than  41   to demonstrate statistically significant improvement in mobility and quality of life.    Baseline 36 05/17/21    Time 4    Period Weeks    Status New    Target Date 06/14/21               PT Long Term Goals - 05/17/21 1126       PT LONG TERM GOAL #1   Title Pt will improve R [place] AROM by 40 degrees with abduction and flexion in order to improve her overall shoulder funciton and ADL completion    Baseline See flowsheets    Time 8    Period Weeks    Status New    Target Date 07/12/21      PT LONG TERM GOAL #2   Title Pt will improve R shoulder strength to 4/5 on the right with flexion, abduction, ER and IR in order to improve shoulder function and ability to perform ADLs    Baseline See flowsheets    Time 8    Period Weeks    Status New    Target Date 07/12/21      PT LONG TERM GOAL #3   Title Patient will increase FOTO score to equal to or greater than  46   to demonstrate statistically significant improvement in mobility and quality of life.    Baseline 36 @ eval 12/2    Time 8    Period Weeks    Status New    Target Date 07/12/21      PT LONG TERM GOAL #4   Title Patient will improve AROM UE so they are able to perform overhead ADL's such as reaching into cabinets.    Baseline unable to perform UE ADLs without use of contralateral UE    Time 8    Period Weeks    Status New    Target Date 07/12/21                    Plan - 05/17/21 1123     Clinical Impression Statement Patient is 75 year old female who presents to therapy following a fall and October where she sustained injury to her right shoulder.  Since fall patient is been completing some exercises at home in order to continue to mobilize the shoulder and maintain movement where possible.  Patient demonstrates significant weakness in the right shoulder demonstrated by inability to lift shoulder above  90 degrees of flexion or abduction and also demonstrates weakness with shoulder internal and external rotation as demonstrated in manual muscle testing.  In addition to weakness patient has some range of motion restrictions as described in flowsheet.  These restrictions are limiting patient's ability to perform daily tasks without pain and discomfort without needing the contralateral upper extremity to assist or complete the task.  Patient will benefit from skilled physical therapy intervention in order to improve her right upper extremity strength, range of motion, improve that he to perform ADLs  and improve overall quality of life.    Personal Factors and Comorbidities Age    Examination-Activity Limitations Carry;Dressing;Lift;Reach Overhead    Examination-Participation Restrictions Cleaning;Laundry;Meal Prep    Stability/Clinical Decision Making Stable/Uncomplicated    Clinical Decision Making Low    Rehab Potential Good    PT Frequency 2x / week    PT Duration 8 weeks    PT Treatment/Interventions ADLs/Self Care Home Management;Therapeutic activities;Therapeutic exercise;Neuromuscular re-education;Patient/family education;Manual techniques;Dry needling;Joint Manipulations    PT Next Visit Plan PROM, stretching, rhythmic isometrics    PT Home Exercise Plan Access Code: [email or web address]  URL: [email or web address]  Date: 05/17/2021  Prepared by: Thresa Ross    Consulted and Agree with Plan of Care Patient;Family member/caregiver    Family Member Consulted husband             Patient will benefit from skilled therapeutic intervention in order to improve the following deficits and impairments:  Decreased activity tolerance, Decreased range of motion, Decreased strength, Impaired UE functional use  Visit Diagnosis: Stiffness of right shoulder, not elsewhere classified - Plan: PT plan of care cert/re-cert  Right shoulder pain, unspecified chronicity - Plan: PT plan of care  cert/re-cert  Muscle weakness (generalized) - Plan: PT plan of care cert/re-cert     Problem List Patient Active Problem List   Diagnosis Date Noted   Acute pain of right shoulder 04/23/2021   Osteoporosis 12/29/2019   Osteoarthritis of both knees 12/27/2019   Hypothyroidism 12/27/2019   Myalgia due to statin 12/27/2019   Hyperlipidemia 12/27/2019   Abnormal auditory perception of right ear 12/27/2019    Norman Herrlich, PT 05/17/2021, 11:49 AM  [facility] MAIN [facility] SERVICES [address] [place], [place], [address] Phone: [phone]   Fax:  [phone]  Name: ALYCEN MACK MRN: [id number] Date of Birth: Apr 05, 1946

## 2021-05-17 NOTE — Patient Instructions (Signed)
Access Code: [email or web address] URL: [email or web address] Date: 05/17/2021 Prepared by: Thresa Ross  Exercises Seated Shoulder Flexion Towel Slide at Table Top - 2 x daily - 7 x weekly - 1 sets - 10 reps - 5 second hold Seated Shoulder Abduction Towel Slide at Table Top - 2 x daily - 7 x weekly - 1 sets - 10 reps - 5 second hold Seated Shoulder Scaption Slide at Table Top with Forearm in Neutral - 2 x daily - 7 x weekly - 1 sets - 10 reps - 5 second hold Standing Isometric Shoulder Internal Rotation with Towel Roll at Doorway - 1 x daily - 7 x weekly - 2 sets - 10 reps - 5 second hold Standing Isometric Shoulder External Rotation with Doorway - 1 x daily - 7 x weekly - 2 sets - 10 reps - 5 sec hold Isometric Shoulder Flexion at Wall - 7 x weekly - 2 sets - 10 reps - 5 sec hold

## 2021-05-21 ENCOUNTER — Other Ambulatory Visit: Payer: Self-pay

## 2021-05-21 ENCOUNTER — Ambulatory Visit: Payer: Medicare PPO | Admitting: Physical Therapy

## 2021-05-21 DIAGNOSIS — M25611 Stiffness of right shoulder, not elsewhere classified: Secondary | ICD-10-CM

## 2021-05-21 DIAGNOSIS — M6281 Muscle weakness (generalized): Secondary | ICD-10-CM | POA: Diagnosis not present

## 2021-05-21 DIAGNOSIS — M25511 Pain in right shoulder: Secondary | ICD-10-CM

## 2021-05-21 DIAGNOSIS — M79601 Pain in right arm: Secondary | ICD-10-CM | POA: Diagnosis not present

## 2021-05-21 NOTE — Therapy (Signed)
[place] [facility] MAIN [facility] SERVICES [address] [place], [place], [address] Phone: [phone]   Fax:  [phone]  Physical Therapy Treatment  Patient Details  Name: Amber Chavez MRN: [id number] Date of Birth: 06-Feb-1946 Referring Provider (PT): Chryl Heck cody   Encounter Date: 05/21/2021   PT End of Session - 05/21/21 0911     Visit Number 2    Number of Visits 16    Date for PT Re-Evaluation 07/12/21    Progress Note Due on Visit 10    PT Start Time 0850    PT Stop Time 0930    PT Time Calculation (min) 40 min    Activity Tolerance Patient tolerated treatment well    Behavior During Therapy WFL for tasks assessed/performed             Past Medical History:  Diagnosis Date   Hypothyroidism    PONV (postoperative nausea and vomiting)    Thyroid disease     Past Surgical History:  Procedure Laterality Date   ABDOMINAL HYSTERECTOMY     APPENDECTOMY     CHOLECYSTECTOMY     CLOSED REDUCTION NASAL FRACTURE N/A 04/19/2021   Procedure: CLOSED REDUCTION NASAL FRACTURE;  Surgeon: Linus Salmons, MD;  Location: [facility];  Service: ENT;  Laterality: N/A;  keep last case   right foot surgery     bone removed, then revision    There were no vitals filed for this visit.   Subjective Assessment - 05/21/21 0854     Subjective Pt reports she has been doing exercises and feels they are going well. Reports minimal pain in the shoulder resting and increased pain when using the shoulder.    Patient is accompained by: Family member    Pertinent History Pt reports she fell forward and injured her shoulder. Reports most of the injury occured to the right shoulder. Pt reports not remembering much of event but does not remember any contorting of the shoulder. Pt reports no prior injury to the shoulder. Pt reports pain with the shoulder with lifting objects and holding weight in. 04/06/21 was the date of the fall. Pt reports no other  lingering injuries from the fall just some lingering scars. Pt reports she and her husband will be moving to [place] on December 06/05/21.    Limitations House hold activities;Lifting    How long can you sit comfortably? N/A    How long can you stand comfortably? N/A    How long can you walk comfortably? n/A    Diagnostic tests no fractures via x-ray    Patient Stated Goals Improve R shoulder funciton and range of motion    Pain Score 3     Pain Location Shoulder    Pain Orientation Right    Pain Descriptors / Indicators Aching    Pain Type Chronic pain    Pain Onset More than a month ago    Pain Frequency Intermittent    Aggravating Factors  ROM                Exercise/Activity Sets/Reps/Time/ Resistance Assistance Charge type Comments  PROM- flex, abduction, ER, IR  10 x 10-30 sec holds at end range   Manual  ER: approximately 45 degrees ER @ 30 degrees abduction  -improved ability for shoulder relaxation at this time.   Mobilizations: PA, Distractions 2-3 min each  Manual Pt notes PA joint mobs cause some discomfort on anterior shoulder, improved with altered PT hand  positioning -pt notes distraction brought some relief  Scapular retractions  2 x 10 x 5 sec   Therex -cues to prevent UT activation, rated easy -consider rows next session with TB   Wall Ladder- flexion, abduciton  X 5 ea x 5 sec holds   Therex 136 flex 132 abd Cues for shoulder and hand positioning as well as cues for distance from ladder on wall to maximize available range of motion   Rhythmic Isometrics  2 x 30 sec    Therex  Cues for shoulder and hand positioning                                       Treatment Provided this session   Pt educated throughout session about proper posture and technique with exercises. Improved exercise technique, movement at target joints, use of target muscles after min to mod verbal, visual, tactile cues. Note: Portions of this document were prepared using Dragon voice  recognition software and although reviewed may contain unintentional dictation errors in syntax, grammar, or spelling.                          PT Education - 05/21/21 1020     Education Details Exercise form and technique    Person(s) Educated Patient;Spouse    Methods Explanation;Demonstration;Tactile cues;Verbal cues    Comprehension Verbalized understanding;Returned demonstration              PT Short Term Goals - 05/17/21 1125       PT SHORT TERM GOAL #1   Title Patient will be independent in home exercise program to improve strength/mobility for better functional independence with ADLs.    Baseline Pt completing soe exercises from internet at home, no formal HEP    Time 4    Period Weeks    Status New    Target Date 06/14/21      PT SHORT TERM GOAL #2   Title Patient will increase FOTO score to equal to or greater than  41   to demonstrate statistically significant improvement in mobility and quality of life.    Baseline 36 05/17/21    Time 4    Period Weeks    Status New    Target Date 06/14/21               PT Long Term Goals - 05/17/21 1126       PT LONG TERM GOAL #1   Title Pt will improve R SHoulder AROM by 40 degrees with abduction and flexion in order to improve her overall shoulder funciton and ADL completion    Baseline See flowsheets    Time 8    Period Weeks    Status New    Target Date 07/12/21      PT LONG TERM GOAL #2   Title Pt will improve R shoulder strength to 4/5 on the right with flexion, abduction, ER and IR in order to improve shoulder function and ability to perform ADLs    Baseline See flowsheets    Time 8    Period Weeks    Status New    Target Date 07/12/21      PT LONG TERM GOAL #3   Title Patient will increase FOTO score to equal to or greater than  46   to demonstrate statistically significant improvement in mobility and quality  of life.    Baseline 36 @ eval 12/2    Time 8    Period Weeks     Status New    Target Date 07/12/21      PT LONG TERM GOAL #4   Title Patient will improve AROM UE so they are able to perform overhead ADL's such as reaching into cabinets.    Baseline unable to perform UE ADLs without use of contralateral UE    Time 8    Period Weeks    Status New    Target Date 07/12/21                   Plan - 05/21/21 0911     Clinical Impression Statement Patient presents to physical therapy with excellent motivation for completion of physical therapy program.  Patient demonstrated progress with several shoulder range of motion's including external rotation, flexion, and abduction, with various exercises performed in today's session.  Patient completed home exercise program with good efficacy.  Patient does note pain with shoulder horizontal abduction.  Patient will continue to benefit from skilled physical therapy intervention in order to improve her lower extremity strength, range of motion, improve her ability to perform ADLs and improve her overall quality of life.    Personal Factors and Comorbidities Age    Examination-Activity Limitations Carry;Dressing;Lift;Reach Overhead    Examination-Participation Restrictions Cleaning;Laundry;Meal Prep    Stability/Clinical Decision Making Stable/Uncomplicated    Rehab Potential Good    PT Frequency 2x / week    PT Duration 8 weeks    PT Treatment/Interventions ADLs/Self Care Home Management;Therapeutic activities;Therapeutic exercise;Neuromuscular re-education;Patient/family education;Manual techniques;Dry needling;Joint Manipulations    PT Next Visit Plan PROM, stretching, rhythmic isometrics    PT Home Exercise Plan Access Code: [email or web address]  URL: [email or web address]  Date: 05/17/2021  Prepared by: Thresa Ross    Consulted and Agree with Plan of Care Patient;Family member/caregiver    Family Member Consulted husband             Patient will benefit from skilled therapeutic intervention  in order to improve the following deficits and impairments:  Decreased activity tolerance, Decreased range of motion, Decreased strength, Impaired UE functional use  Visit Diagnosis: Stiffness of right shoulder, not elsewhere classified  Right shoulder pain, unspecified chronicity  Muscle weakness (generalized)     Problem List Patient Active Problem List   Diagnosis Date Noted   Acute pain of right shoulder 04/23/2021   Osteoporosis 12/29/2019   Osteoarthritis of both knees 12/27/2019   Hypothyroidism 12/27/2019   Myalgia due to statin 12/27/2019   Hyperlipidemia 12/27/2019   Abnormal auditory perception of right ear 12/27/2019    Norman Herrlich, PT 05/21/2021, 10:23 AM  [place] [facility] MAIN [facility] SERVICES [address] [place], [place], [address] Phone: [phone]   Fax:  [phone]  Name: Amber Chavez MRN: [id number] Date of Birth: [birth year]

## 2021-05-24 ENCOUNTER — Ambulatory Visit: Payer: Medicare PPO | Admitting: Physical Therapy

## 2021-05-24 ENCOUNTER — Other Ambulatory Visit: Payer: Self-pay

## 2021-05-24 DIAGNOSIS — M6281 Muscle weakness (generalized): Secondary | ICD-10-CM | POA: Diagnosis not present

## 2021-05-24 DIAGNOSIS — M25611 Stiffness of right shoulder, not elsewhere classified: Secondary | ICD-10-CM

## 2021-05-24 DIAGNOSIS — M79601 Pain in right arm: Secondary | ICD-10-CM | POA: Diagnosis not present

## 2021-05-24 DIAGNOSIS — M25511 Pain in right shoulder: Secondary | ICD-10-CM | POA: Diagnosis not present

## 2021-05-24 NOTE — Therapy (Signed)
[place] [facility] MAIN [facility] SERVICES [address] [place], [place], [address] Phone: [phone]   Fax:  [phone]  Physical Therapy Treatment  Patient Details  Name: Amber Chavez MRN: [id number] Date of Birth: Apr 10, 1946 Referring Provider (PT): Chryl Heck cody   Encounter Date: 05/24/2021   PT End of Session - 05/24/21 1052     Visit Number 3    Number of Visits 16    Date for PT Re-Evaluation 07/12/21    Progress Note Due on Visit 10    PT Start Time 1014    PT Stop Time 1100    PT Time Calculation (min) 46 min    Activity Tolerance Patient tolerated treatment well    Behavior During Therapy WFL for tasks assessed/performed             Past Medical History:  Diagnosis Date   Hypothyroidism    PONV (postoperative nausea and vomiting)    Thyroid disease     Past Surgical History:  Procedure Laterality Date   ABDOMINAL HYSTERECTOMY     APPENDECTOMY     CHOLECYSTECTOMY     CLOSED REDUCTION NASAL FRACTURE N/A 04/19/2021   Procedure: CLOSED REDUCTION NASAL FRACTURE;  Surgeon: Linus Salmons, MD;  Location: [facility];  Service: ENT;  Laterality: N/A;  keep last case   right foot surgery     bone removed, then revision    There were no vitals filed for this visit.   Subjective Assessment - 05/24/21 1015     Subjective Pt reports she has been doing exercises and feels they are going well. Reports minimal pain in the shoulder resting and increased pain when using the shoulder.    Patient is accompained by: Family member    Pertinent History Pt reports she fell forward and injured her shoulder. Reports most of the injury occured to the right shoulder. Pt reports not remembering much of event but does not remember any contorting of the shoulder. Pt reports no prior injury to the shoulder. Pt reports pain with the shoulder with lifting objects and holding weight in. 04/06/21 was the date of the fall. Pt reports no other  lingering injuries from the fall just some lingering scars. Pt reports she and her husband will be moving to [place] on December 06/05/21.    Limitations House hold activities;Lifting    How long can you sit comfortably? N/A    How long can you stand comfortably? N/A    How long can you walk comfortably? n/A    Diagnostic tests no fractures via x-ray    Patient Stated Goals Improve R shoulder funciton and range of motion    Currently in Pain? Yes    Pain Score 3     Pain Location Shoulder    Pain Descriptors / Indicators Sore    Pain Type Chronic pain    Pain Onset More than a month ago    Aggravating Factors  use of UE                Exercise/Activity Sets/Reps/Time/ Resistance Assistance Charge type Comments  PROM- flex, abduction, ER, IR  10 x 10-30 sec holds at end range   Manual  ER: approximately 45 degrees ER @ 30 degrees abduction  -improved ability for shoulder relaxation at this time.   Mobilizations: PA, Distractions 2-3 min each  Manual Pt notes PA joint mobs cause some discomfort on anterior shoulder, improved with altered PT hand positioning -pt notes  distraction brought some relief  Scapular retractions  x 10 x 5 sec   Therex -cues to prevent UT activation, rated easy -consider rows next session with TB   Wall Ladder- flexion, abduciton  2 X 4 ea x 5 sec holds   Therex 136 flex 134 abd Cues for shoulder and hand positioning as well as cues for distance from ladder on wall to maximize available range of motion   Rows  2 x 10 YTB  Therex  Progression from scapular retractions   Iso ER and IR  10 x 5 sec ea  therex Cues for Proper shoulder positioning                                 Treatment Provided this session   Pt educated throughout session about proper posture and technique with exercises. Improved exercise technique, movement at target joints, use of target muscles after min to mod verbal, visual, tactile cues. Note: Portions of this document were  prepared using Dragon voice recognition software and although reviewed may contain unintentional dictation errors in syntax, grammar, or spelling.                            PT Short Term Goals - 05/17/21 1125       PT SHORT TERM GOAL #1   Title Patient will be independent in home exercise program to improve strength/mobility for better functional independence with ADLs.    Baseline Pt completing soe exercises from internet at home, no formal HEP    Time 4    Period Weeks    Status New    Target Date 06/14/21      PT SHORT TERM GOAL #2   Title Patient will increase FOTO score to equal to or greater than  41   to demonstrate statistically significant improvement in mobility and quality of life.    Baseline 36 05/17/21    Time 4    Period Weeks    Status New    Target Date 06/14/21               PT Long Term Goals - 05/17/21 1126       PT LONG TERM GOAL #1   Title Pt will improve R SHoulder AROM by 40 degrees with abduction and flexion in order to improve her overall shoulder funciton and ADL completion    Baseline See flowsheets    Time 8    Period Weeks    Status New    Target Date 07/12/21      PT LONG TERM GOAL #2   Title Pt will improve R shoulder strength to 4/5 on the right with flexion, abduction, ER and IR in order to improve shoulder function and ability to perform ADLs    Baseline See flowsheets    Time 8    Period Weeks    Status New    Target Date 07/12/21      PT LONG TERM GOAL #3   Title Patient will increase FOTO score to equal to or greater than  46   to demonstrate statistically significant improvement in mobility and quality of life.    Baseline 36 @ eval 12/2    Time 8    Period Weeks    Status New    Target Date 07/12/21      PT LONG TERM GOAL #4  Title Patient will improve AROM UE so they are able to perform overhead ADL's such as reaching into cabinets.    Baseline unable to perform UE ADLs without use of  contralateral UE    Time 8    Period Weeks    Status New    Target Date 07/12/21                   Plan - 05/24/21 1053     Clinical Impression Statement Patient presents to physical therapy with excellent motivation for completion of physical therapy program. Patient demonstrated progress with several shoulder range of motion's including external rotation, flexion, and abduction, with various exercises performed in today's session. Patient completed home exercise program with good efficacy. Patient does note pain with shoulder horizontal abduction. Patient will continue to benefit from skilled physical therapy intervention in order to improve her lower extremity strength, range of motion, improve her ability to perform ADLs and improve her overall quality of life.    Personal Factors and Comorbidities Age    Examination-Activity Limitations Carry;Dressing;Lift;Reach Overhead    Examination-Participation Restrictions Cleaning;Laundry;Meal Prep    Stability/Clinical Decision Making Stable/Uncomplicated    Rehab Potential Good    PT Frequency 2x / week    PT Duration 8 weeks    PT Treatment/Interventions ADLs/Self Care Home Management;Therapeutic activities;Therapeutic exercise;Neuromuscular re-education;Patient/family education;Manual techniques;Dry needling;Joint Manipulations    PT Next Visit Plan PROM, stretching, rhythmic isometrics    PT Home Exercise Plan Access Code: [email or web address]  URL: [email or web address]  Date: 05/17/2021  Prepared by: Thresa Ross    Consulted and Agree with Plan of Care Patient;Family member/caregiver    Family Member Consulted husband             Patient will benefit from skilled therapeutic intervention in order to improve the following deficits and impairments:  Decreased activity tolerance, Decreased range of motion, Decreased strength, Impaired UE functional use  Visit Diagnosis: Stiffness of right shoulder, not elsewhere  classified  Right shoulder pain, unspecified chronicity  Muscle weakness (generalized)     Problem List Patient Active Problem List   Diagnosis Date Noted   Acute pain of right shoulder 04/23/2021   Osteoporosis 12/29/2019   Osteoarthritis of both knees 12/27/2019   Hypothyroidism 12/27/2019   Myalgia due to statin 12/27/2019   Hyperlipidemia 12/27/2019   Abnormal auditory perception of right ear 12/27/2019    Norman Herrlich, PT 05/24/2021, 10:53 AM  [place] [facility] MAIN [facility] SERVICES [address] [place], [place], [address] Phone: [phone]   Fax:  [phone]  Name: Amber Chavez MRN: [id number] Date of Birth: 1946/06/15

## 2021-05-24 NOTE — Patient Instructions (Signed)
Access Code: [email or web address] URL: [email or web address] Date: 05/24/2021 Prepared by: Thresa Ross  Exercises Standing Shoulder Row with Anchored Resistance - 2 x daily - 7 x weekly - 2 sets - 10 reps Supine Shoulder Flexion AAROM with Dowel - 1 x daily - 7 x weekly - 1 sets - 10 reps - 5 sec hold

## 2021-05-28 ENCOUNTER — Ambulatory Visit: Payer: Medicare PPO | Admitting: Physical Therapy

## 2021-05-28 ENCOUNTER — Other Ambulatory Visit: Payer: Self-pay

## 2021-05-28 DIAGNOSIS — M25611 Stiffness of right shoulder, not elsewhere classified: Secondary | ICD-10-CM | POA: Diagnosis not present

## 2021-05-28 DIAGNOSIS — M79601 Pain in right arm: Secondary | ICD-10-CM | POA: Diagnosis not present

## 2021-05-28 DIAGNOSIS — M6281 Muscle weakness (generalized): Secondary | ICD-10-CM

## 2021-05-28 DIAGNOSIS — M25511 Pain in right shoulder: Secondary | ICD-10-CM

## 2021-05-28 NOTE — Therapy (Signed)
[facility] MAIN [facility] SERVICES [address] [place], [place], [address] Phone: [phone]   Fax:  [phone]  Physical Therapy Treatment  Patient Details  Name: Amber Chavez MRN: [id number] Date of Birth: 1945/09/29 Referring Provider (PT): Chryl Heck cody   Encounter Date: 05/28/2021   PT End of Session - 05/28/21 1041     Visit Number 4    Number of Visits 16    Date for PT Re-Evaluation 07/12/21    Progress Note Due on Visit 10    PT Start Time 1016    PT Stop Time 1059    PT Time Calculation (min) 43 min    Activity Tolerance Patient tolerated treatment well    Behavior During Therapy WFL for tasks assessed/performed             Past Medical History:  Diagnosis Date   Hypothyroidism    PONV (postoperative nausea and vomiting)    Thyroid disease     Past Surgical History:  Procedure Laterality Date   ABDOMINAL HYSTERECTOMY     APPENDECTOMY     CHOLECYSTECTOMY     CLOSED REDUCTION NASAL FRACTURE N/A 04/19/2021   Procedure: CLOSED REDUCTION NASAL FRACTURE;  Surgeon: Linus Salmons, MD;  Location: [facility];  Service: ENT;  Laterality: N/A;  keep last case   right foot surgery     bone removed, then revision    There were no vitals filed for this visit.   Subjective Assessment - 05/28/21 1019     Subjective Pt reports she has been doing exercises and feels they are going well. Reports minimal pain in the shoulder resting and increased pain when using the shoulder. Pt reports she has been continuing with HEP despite full days of packing and moving.    Patient is accompained by: Family member    Pertinent History Pt reports she fell forward and injured her shoulder. Reports most of the injury occured to the right shoulder. Pt reports not remembering much of event but does not remember any contorting of the shoulder. Pt reports no prior injury to the shoulder. Pt reports pain with the shoulder with lifting  objects and holding weight in. 04/06/21 was the date of the fall. Pt reports no other lingering injuries from the fall just some lingering scars. Pt reports she and her husband will be moving to [place] on December 06/05/21.    Limitations House hold activities;Lifting    How long can you sit comfortably? N/A    How long can you stand comfortably? N/A    How long can you walk comfortably? n/A    Diagnostic tests no fractures via x-ray    Patient Stated Goals Improve R shoulder funciton and range of motion    Pain Onset More than a month ago                   Exercise/Activity Sets/Reps/Time/ Resistance Assistance Charge type Comments  PROM- flex, abduction, ER, IR  10 x 10-30 sec holds at end range   Manual  ER: improve motion this session, some popping noted with ER but was aboloshed folowing gentke joint mobs  -improved ability for shoulder relaxation at this time.   Mobilizations: PA, Distractions 2-3 min each  Manual Pt notes PA joint mobs cause some discomfort on anterior shoulder, improved with altered PT hand positioning -pt notes distraction brought some relief  Wand shouldr flexion  2x5x 5 sec hold   Therex -Cues for  guiding movement with R UE to allow improved ROM without pain/discomfort   Wall Ladder- abduciton  1 X 4 ea x 5 sec holds   Therex Cues for shoulder and hand positioning as well as cues for distance from ladder on wall to maximize available range of motion   Rows  2 x 10 RTB  Therex  Progression from scapular retractions         Seated Bil ER 2x 10 YTB with 5 se holds    Progression from isometrics, no pain noted, cues for elbow positioning - added to HEP     Standing resisted internal rotation  2x10 YTB  Therex Progression from isometrics, no pain noted, cues for elbow positioning - added to HEP                     Treatment Provided this session   Pt educated throughout session about proper posture and technique with exercises. Improved exercise  technique, movement at target joints, use of target muscles after min to mod verbal, visual, tactile cues. Note: Portions of this document were prepared using Dragon voice recognition software and although reviewed may contain unintentional dictation errors in syntax, grammar, or spelling.                       PT Education - 05/28/21 1020     Education Details Exercise form and technique with new exercises    Person(s) Educated Patient;Spouse    Methods Explanation;Demonstration;Tactile cues;Handout    Comprehension Verbalized understanding;Returned demonstration              PT Short Term Goals - 05/17/21 1125       PT SHORT TERM GOAL #1   Title Patient will be independent in home exercise program to improve strength/mobility for better functional independence with ADLs.    Baseline Pt completing soe exercises from internet at home, no formal HEP    Time 4    Period Weeks    Status New    Target Date 06/14/21      PT SHORT TERM GOAL #2   Title Patient will increase FOTO score to equal to or greater than  41   to demonstrate statistically significant improvement in mobility and quality of life.    Baseline 36 05/17/21    Time 4    Period Weeks    Status New    Target Date 06/14/21               PT Long Term Goals - 05/17/21 1126       PT LONG TERM GOAL #1   Title Pt will improve R SHoulder AROM by 40 degrees with abduction and flexion in order to improve her overall shoulder funciton and ADL completion    Baseline See flowsheets    Time 8    Period Weeks    Status New    Target Date 07/12/21      PT LONG TERM GOAL #2   Title Pt will improve R shoulder strength to 4/5 on the right with flexion, abduction, ER and IR in order to improve shoulder function and ability to perform ADLs    Baseline See flowsheets    Time 8    Period Weeks    Status New    Target Date 07/12/21      PT LONG TERM GOAL #3   Title Patient will increase FOTO score  to equal to or greater than  46   to demonstrate statistically significant improvement in mobility and quality of life.    Baseline 36 @ eval 12/2    Time 8    Period Weeks    Status New    Target Date 07/12/21      PT LONG TERM GOAL #4   Title Patient will improve AROM UE so they are able to perform overhead ADL's such as reaching into cabinets.    Baseline unable to perform UE ADLs without use of contralateral UE    Time 8    Period Weeks    Status New    Target Date 07/12/21                   Plan - 05/28/21 1332     Clinical Impression Statement Patient presents to physical therapy with excellent motivation for completion of physical therapy program. Patient demonstrated progress with several shoulder range of motion's including external rotation, flexion, and abduction, with various exercises performed in today's session. Patient completed home exercise program with good efficacy. Pt had some initial popping sensation in R shoulder joint but following gentle mobilizations popping was aboloshed with PROM. Pt continueing to progress as expected with therapy. Continue to progress HEP with each visit as appropriate. Patient will continue to benefit from skilled physical therapy intervention in order to improve her lower extremity strength, range of motion, improve her ability to perform ADLs and improve her overall quality of life.    Personal Factors and Comorbidities Age    Examination-Activity Limitations Carry;Dressing;Lift;Reach Overhead    Examination-Participation Restrictions Cleaning;Laundry;Meal Prep    Stability/Clinical Decision Making Stable/Uncomplicated    Rehab Potential Good    PT Frequency 2x / week    PT Duration 8 weeks    PT Treatment/Interventions ADLs/Self Care Home Management;Therapeutic activities;Therapeutic exercise;Neuromuscular re-education;Patient/family education;Manual techniques;Dry needling;Joint Manipulations    PT Next Visit Plan PROM,  stretching, rhythmic isometrics    PT Home Exercise Plan updated 05/28/21    Consulted and Agree with Plan of Care Patient;Family member/caregiver    Family Member Consulted husband             Patient will benefit from skilled therapeutic intervention in order to improve the following deficits and impairments:  Decreased activity tolerance, Decreased range of motion, Decreased strength, Impaired UE functional use  Visit Diagnosis: Stiffness of right shoulder, not elsewhere classified  Right shoulder pain, unspecified chronicity  Muscle weakness (generalized)     Problem List Patient Active Problem List   Diagnosis Date Noted   Acute pain of right shoulder 04/23/2021   Osteoporosis 12/29/2019   Osteoarthritis of both knees 12/27/2019   Hypothyroidism 12/27/2019   Myalgia due to statin 12/27/2019   Hyperlipidemia 12/27/2019   Abnormal auditory perception of right ear 12/27/2019    Norman Herrlich, PT 05/28/2021, 1:38 PM  [facility] MAIN [facility] SERVICES [address] [place], [place], [address] Phone: [phone]   Fax:  [phone]  Name: Amber Chavez MRN: [id number] Date of Birth: 04-17-1946

## 2021-05-31 ENCOUNTER — Other Ambulatory Visit: Payer: Self-pay

## 2021-05-31 ENCOUNTER — Ambulatory Visit: Payer: Medicare PPO | Admitting: Physical Therapy

## 2021-05-31 DIAGNOSIS — M25611 Stiffness of right shoulder, not elsewhere classified: Secondary | ICD-10-CM

## 2021-05-31 DIAGNOSIS — M6281 Muscle weakness (generalized): Secondary | ICD-10-CM | POA: Diagnosis not present

## 2021-05-31 DIAGNOSIS — M79601 Pain in right arm: Secondary | ICD-10-CM | POA: Diagnosis not present

## 2021-05-31 DIAGNOSIS — M25511 Pain in right shoulder: Secondary | ICD-10-CM | POA: Diagnosis not present

## 2021-05-31 NOTE — Therapy (Signed)
[facility] MAIN [facility] SERVICES [address] [place], [place], [address] Phone: [phone]   Fax:  [phone]  Physical Therapy Treatment  Patient Details  Name: Amber Chavez MRN: [id number] Date of Birth: Sep 17, 1945 Referring Provider (PT): Chryl Heck cody   Encounter Date: 05/31/2021   PT End of Session - 05/31/21 1047     Visit Number 5    Number of Visits 16    Date for PT Re-Evaluation 07/12/21    Progress Note Due on Visit 10    PT Start Time 1016    PT Stop Time 1100    PT Time Calculation (min) 44 min    Activity Tolerance Patient tolerated treatment well    Behavior During Therapy WFL for tasks assessed/performed             Past Medical History:  Diagnosis Date   Hypothyroidism    PONV (postoperative nausea and vomiting)    Thyroid disease     Past Surgical History:  Procedure Laterality Date   ABDOMINAL HYSTERECTOMY     APPENDECTOMY     CHOLECYSTECTOMY     CLOSED REDUCTION NASAL FRACTURE N/A 04/19/2021   Procedure: CLOSED REDUCTION NASAL FRACTURE;  Surgeon: Linus Salmons, MD;  Location: [facility];  Service: ENT;  Laterality: N/A;  keep last case   right foot surgery     bone removed, then revision    There were no vitals filed for this visit.   Subjective Assessment - 05/31/21 1037     Subjective Pt reports she has been doing exercises and feels they are going well. Reports minimal pain in the shoulder resting and increased pain when using the shoulder. Pt reports she has been continuing with HEP despite full days of packing and moving.    Patient is accompained by: Family member    Pertinent History Pt reports she fell forward and injured her shoulder. Reports most of the injury occured to the right shoulder. Pt reports not remembering much of event but does not remember any contorting of the shoulder. Pt reports no prior injury to the shoulder. Pt reports pain with the shoulder with lifting  objects and holding weight in. 04/06/21 was the date of the fall. Pt reports no other lingering injuries from the fall just some lingering scars. Pt reports she and her husband will be moving to [place] on December 06/05/21.    Limitations House hold activities;Lifting    How long can you sit comfortably? N/A    How long can you stand comfortably? N/A    How long can you walk comfortably? n/A    Diagnostic tests no fractures via x-ray    Patient Stated Goals Improve R shoulder funciton and range of motion    Pain Onset More than a month ago                  Exercise/Activity Sets/Reps/Time/ Resistance Assistance Charge type Comments  PROM- flex, abduction, ER, IR  10 x 10-30 sec holds at end range   Manual  ER: improve motion this session, some popping noted with ER but was aboloshed folowing gentke joint mobs  -improved ability for shoulder relaxation at this time.   Mobilizations: AP, Distractions 2-3 min each  Manual -pt notes distraction brought some relief -AP joint mobs improved shoulder IR mobility and motion  Wand shoulder flexion  2x5x 5 sec hold   Therex -Cues for guiding movement with R UE to allow improved ROM  without pain/discomfort   Wall Ladder- abduciton  1 X 4 ea x 5 sec holds   Therex Cues for shoulder and hand positioning as well as cues for distance from ladder on wall to maximize available range of motion  Also cues for proper placement of hand to minimize pain with motion               Seated Bil ER 2x 10 YTB with 5 se holds   Therex -Progression from isometrics, no pain noted, cues for elbow positioning - -utilized towel roll under right shoulder for improved RTC activation and pt reports ipmroved comfort with this activity     Standing resisted internal rotation  2x10 YTB  Therex -Progression from isometrics, no pain noted, cues for elbow positioning -  -utilized towel roll under right shoulder for improved RTC activation and pt reports ipmroved comfort with  this activity         PROM at 45 degrees ABD IR: 80 ER: 75    Improved IR following AP joint mobs   AAROM flexion: 146 Abduction: 125      Treatment Provided this session   Pt educated throughout session about proper posture and technique with exercises. Improved exercise technique, movement at target joints, use of target muscles after min to mod verbal, visual, tactile cues. Note: Portions of this document were prepared using Dragon voice recognition software and although reviewed may contain unintentional dictation errors in syntax, grammar, or spelling.                              PT Short Term Goals - 05/31/21 1104       PT SHORT TERM GOAL #1   Title Patient will be independent in home exercise program to improve strength/mobility for better functional independence with ADLs.    Baseline Pt completing soe exercises from internet at home, no formal HEP    Time 4    Period Weeks    Status Achieved    Target Date 06/14/21      PT SHORT TERM GOAL #2   Title Patient will increase FOTO score to equal to or greater than  41   to demonstrate statistically significant improvement in mobility and quality of life.    Baseline 36 05/17/21    Time 4    Period Weeks    Status Achieved    Target Date 06/14/21               PT Long Term Goals - 05/31/21 1104       PT LONG TERM GOAL #1   Title Pt will improve R SHoulder AROM by 40 degrees with abduction and flexion in order to improve her overall shoulder funciton and ADL completion    Baseline See flowsheets    Time 8    Period Weeks    Status New    Target Date 07/12/21      PT LONG TERM GOAL #2   Title Pt will improve R shoulder strength to 4/5 on the right with flexion, abduction, ER and IR in order to improve shoulder function and ability to perform ADLs    Baseline See flowsheets    Time 8    Period Weeks    Status New    Target Date 07/12/21      PT LONG TERM GOAL #3   Title Patient will  increase FOTO score to equal to or greater  than  46   to demonstrate statistically significant improvement in mobility and quality of life.    Baseline 36 @ eval 12/2, 50 on 12/16    Time 8    Period Weeks    Status New    Target Date 07/12/21      PT LONG TERM GOAL #4   Title Patient will improve AROM UE so they are able to perform overhead ADL's such as reaching into cabinets.    Baseline unable to perform UE ADLs without use of contralateral UE, still painful with AROM, not specifically tested this sesion    Time 8    Period Weeks    Status New    Target Date 07/12/21                   Plan - 05/31/21 1047     Clinical Impression Statement Patient presents to physical therapy with excellent motivation for completion of physical therapy program. Patient demonstrated progress with several shoulder range of motion's including external rotation, flexion, and abduction, with various exercises performed in today's session. Patient completing home exercise program with good efficacy.  Pt continueing to progress as expected with therapy. . Patient will continue to benefit from skilled physical therapy intervention in order to improve her lower extremity strength, range of motion, improve her ability to perform ADLs and improve her overall quality of life. Pt is moving to [place] next week and is instructed to continue with PT services following her move. Pt has been in contact with MD regarding this transition.    Personal Factors and Comorbidities Age    Examination-Activity Limitations Carry;Dressing;Lift;Reach Overhead    Examination-Participation Restrictions Cleaning;Laundry;Meal Prep    Stability/Clinical Decision Making Stable/Uncomplicated    Rehab Potential Good    PT Frequency 2x / week    PT Duration 8 weeks    PT Treatment/Interventions ADLs/Self Care Home Management;Therapeutic activities;Therapeutic exercise;Neuromuscular re-education;Patient/family education;Manual  techniques;Dry needling;Joint Manipulations    PT Next Visit Plan PROM, stretching, rhythmic isometrics    PT Home Exercise Plan updated 05/28/21    Consulted and Agree with Plan of Care Patient;Family member/caregiver    Family Member Consulted husband             Patient will benefit from skilled therapeutic intervention in order to improve the following deficits and impairments:  Decreased activity tolerance, Decreased range of motion, Decreased strength, Impaired UE functional use  Visit Diagnosis: Stiffness of right shoulder, not elsewhere classified  Right shoulder pain, unspecified chronicity  Muscle weakness (generalized)     Problem List Patient Active Problem List   Diagnosis Date Noted   Acute pain of right shoulder 04/23/2021   Osteoporosis 12/29/2019   Osteoarthritis of both knees 12/27/2019   Hypothyroidism 12/27/2019   Myalgia due to statin 12/27/2019   Hyperlipidemia 12/27/2019   Abnormal auditory perception of right ear 12/27/2019    Norman Herrlich, PT 05/31/2021, 11:17 AM  [facility] MAIN [facility] SERVICES [address] [place], [place], [address] Phone: [phone]   Fax:  [phone]  Name: Amber Chavez MRN: [id number] Date of Birth: 23-Jan-1946

## 2021-06-04 ENCOUNTER — Ambulatory Visit: Payer: Medicare PPO | Admitting: Physical Therapy

## 2021-06-07 ENCOUNTER — Ambulatory Visit: Payer: Medicare PPO | Admitting: Physical Therapy

## 2021-06-10 DIAGNOSIS — M25462 Effusion, left knee: Secondary | ICD-10-CM | POA: Diagnosis not present

## 2021-06-12 DIAGNOSIS — M170 Bilateral primary osteoarthritis of knee: Secondary | ICD-10-CM | POA: Diagnosis not present

## 2021-06-12 DIAGNOSIS — M25562 Pain in left knee: Secondary | ICD-10-CM | POA: Diagnosis not present

## 2021-06-12 DIAGNOSIS — Z6833 Body mass index (BMI) 33.0-33.9, adult: Secondary | ICD-10-CM | POA: Diagnosis not present

## 2021-06-15 ENCOUNTER — Other Ambulatory Visit: Payer: Self-pay | Admitting: Family Medicine

## 2021-06-15 DIAGNOSIS — E039 Hypothyroidism, unspecified: Secondary | ICD-10-CM

## 2021-06-20 ENCOUNTER — Encounter: Payer: Self-pay | Admitting: Family Medicine

## 2021-06-20 NOTE — Telephone Encounter (Signed)
Unable to LM to schedule appt  Letter mailed

## 2021-10-01 ENCOUNTER — Telehealth: Payer: Self-pay | Admitting: Family Medicine

## 2021-10-01 NOTE — Telephone Encounter (Signed)
Spoke with patient to schedule AWV ? ?Patient stated she has moved [facility] and has a provider there. ? ?Please remove PCP ?

## 2023-07-09 IMAGING — CR DG HUMERUS 2V *R*
2 series · 2 of 2 positions shown · non-contrast
Comparison: Chest radiographs today.

CLINICAL DATA: 75-year-old female status post fall with pain.

EXAM:
RIGHT HUMERUS - 2+ VIEW

[humerus ap]
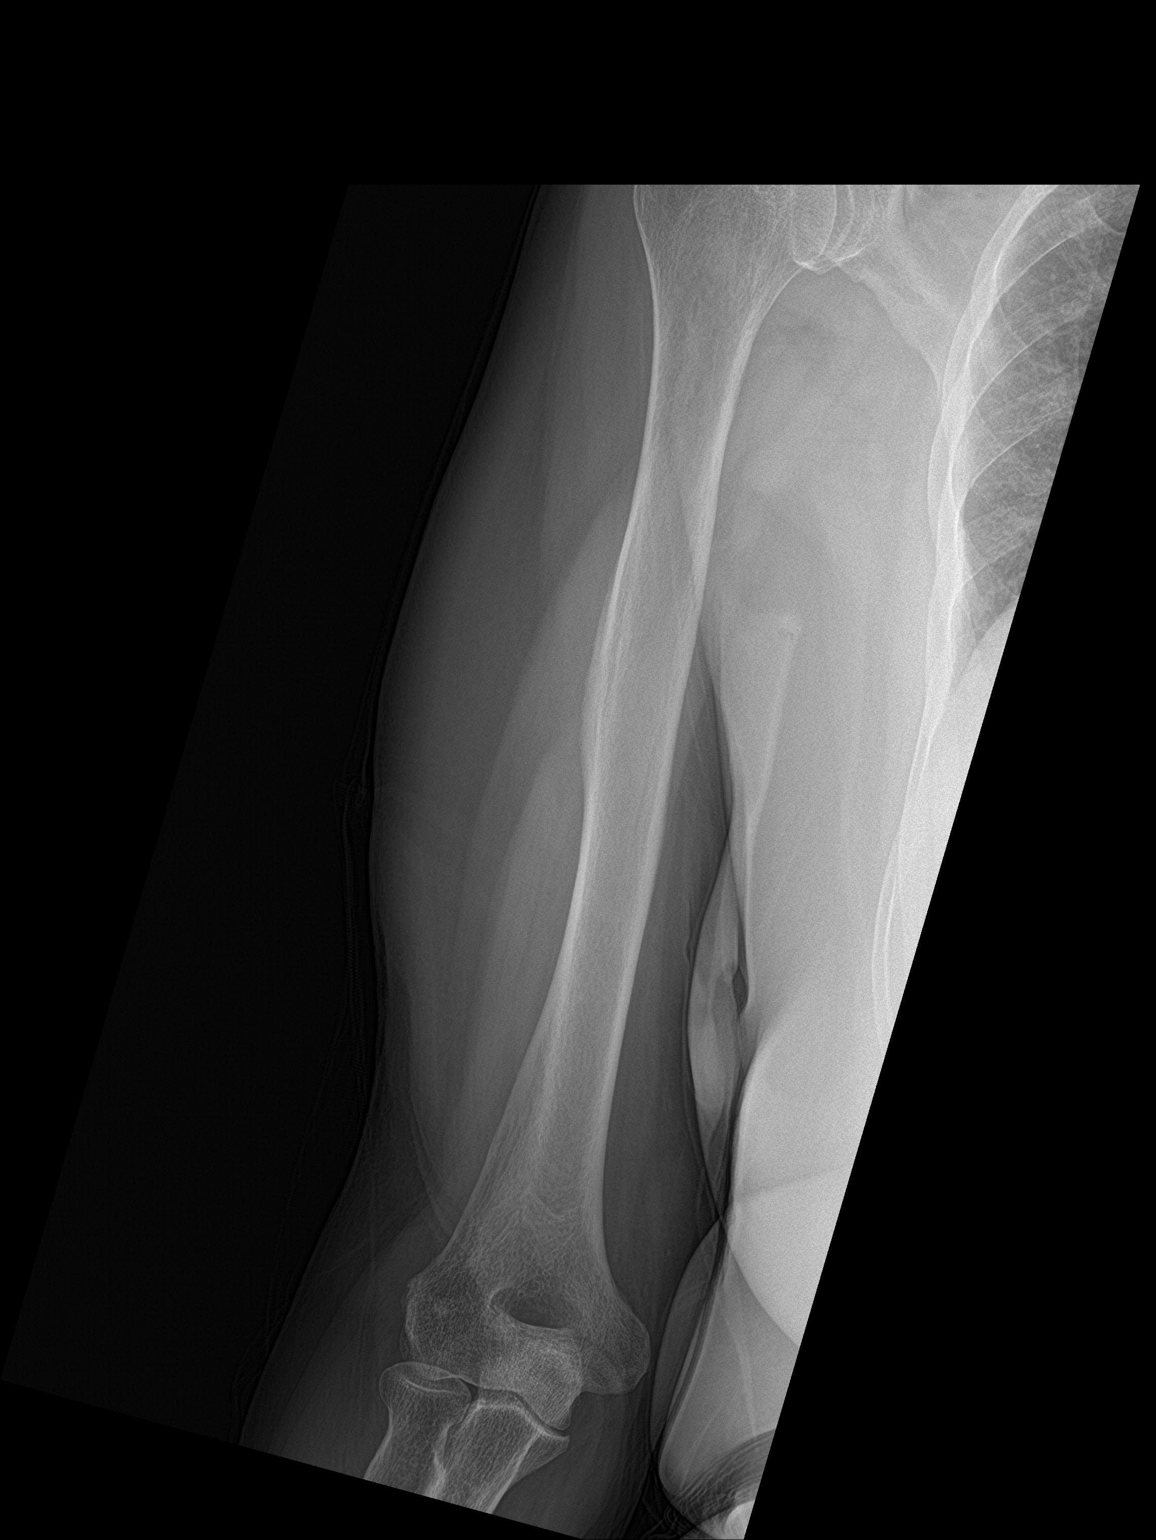

[humerus lat]
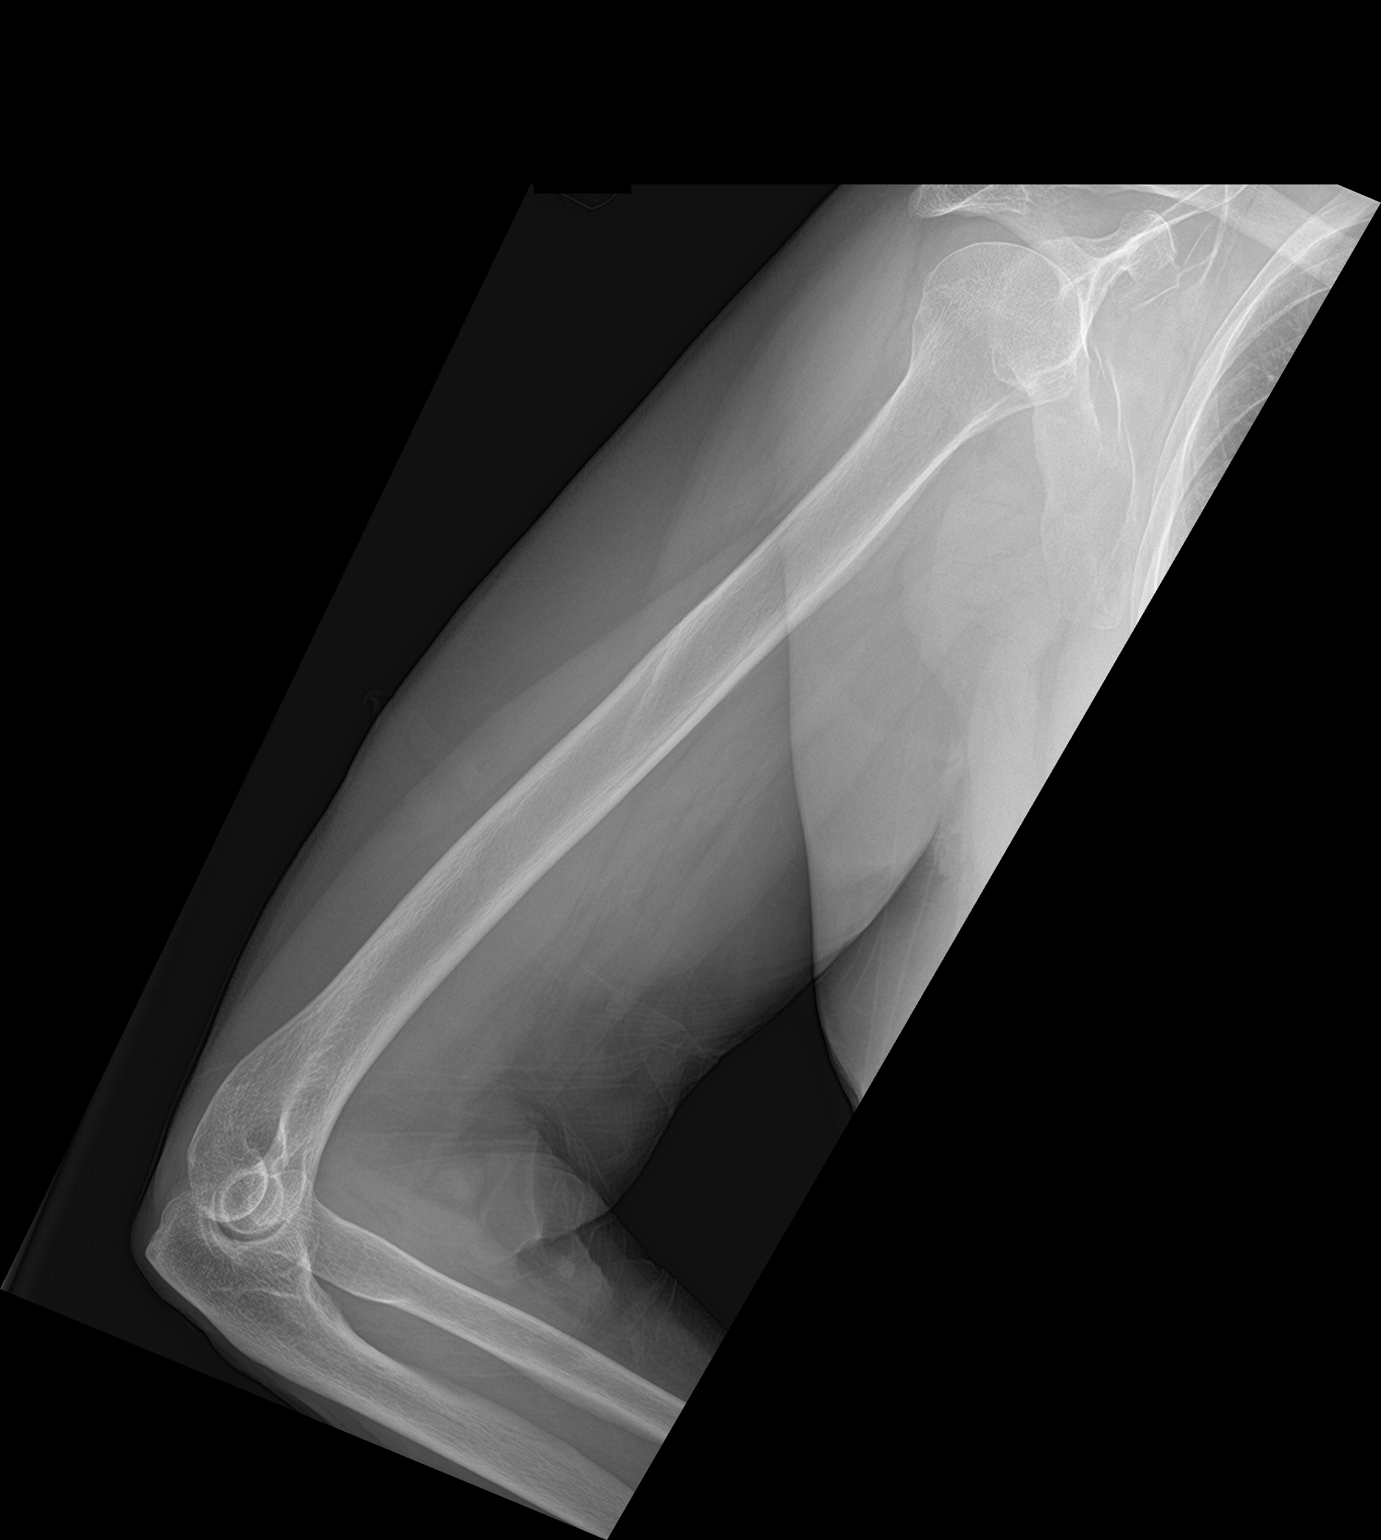

[2 of 2 positions shown; findings below may reference images not displayed]

FINDINGS: Bone mineralization is within normal limits. Alignment at the right
shoulder and elbow appears preserved. There is no evidence of
fracture or other focal bone lesions. = negative visible right
chest.
IMPRESSION: No acute fracture or dislocation identified about the right humerus.

## 2023-07-09 IMAGING — DX DG ELBOW 2V*R*
2 series · 2 of 2 positions shown · non-contrast
Comparison: Humerus radiographs dated 04/06/2021.

CLINICAL DATA: Fall with pain

EXAM:
RIGHT ELBOW - 2 VIEW

[elbow ap]
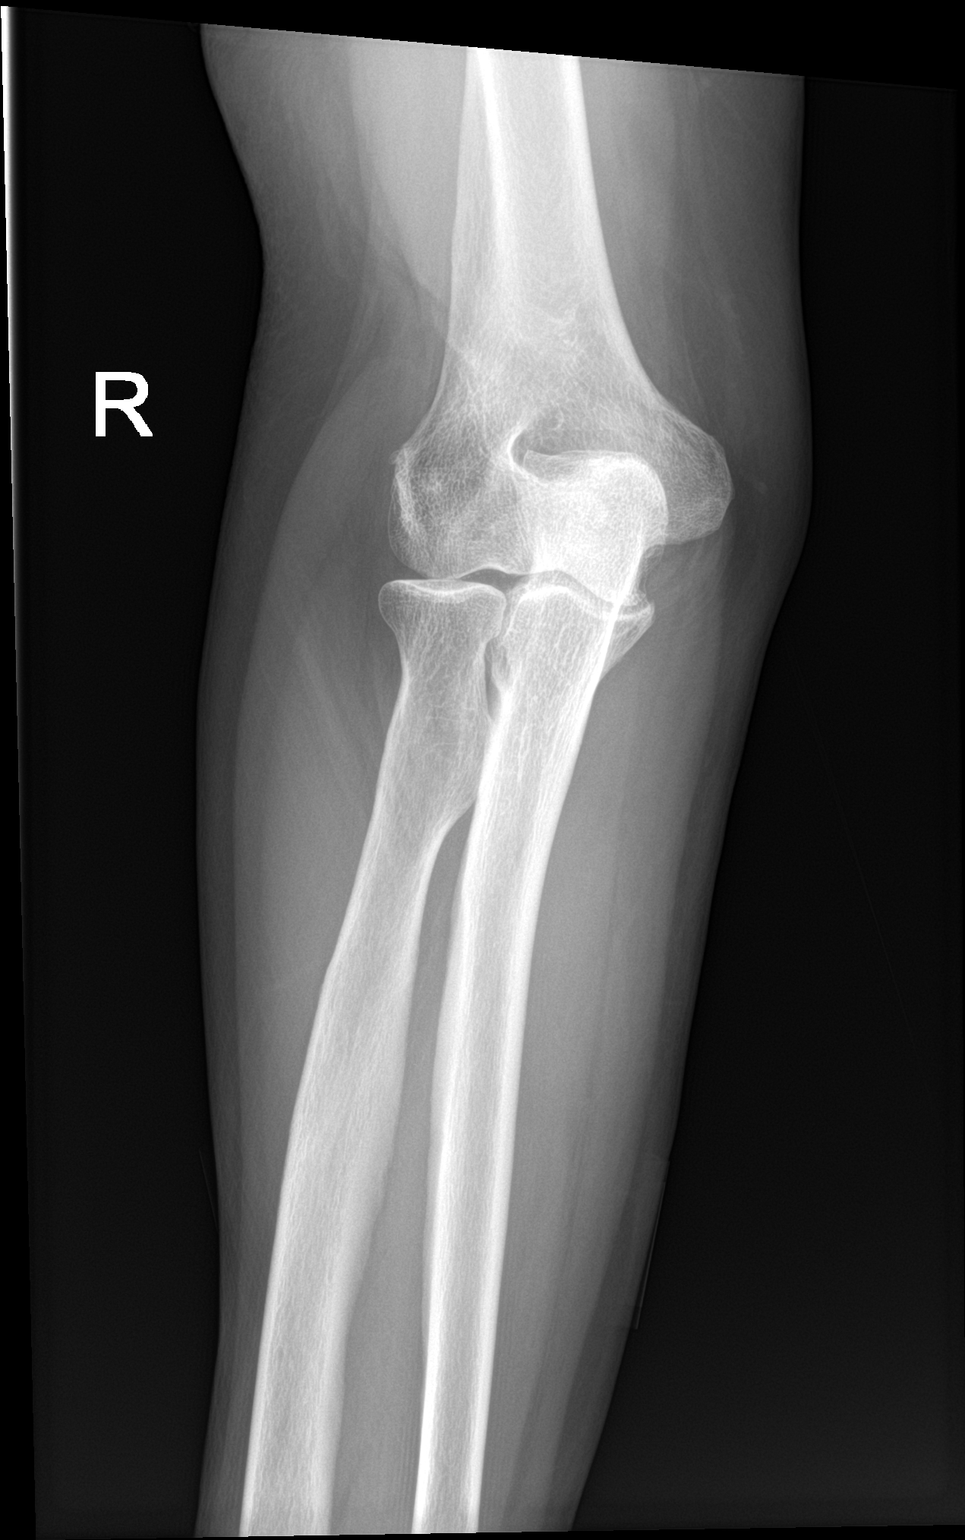

[elbow lat]
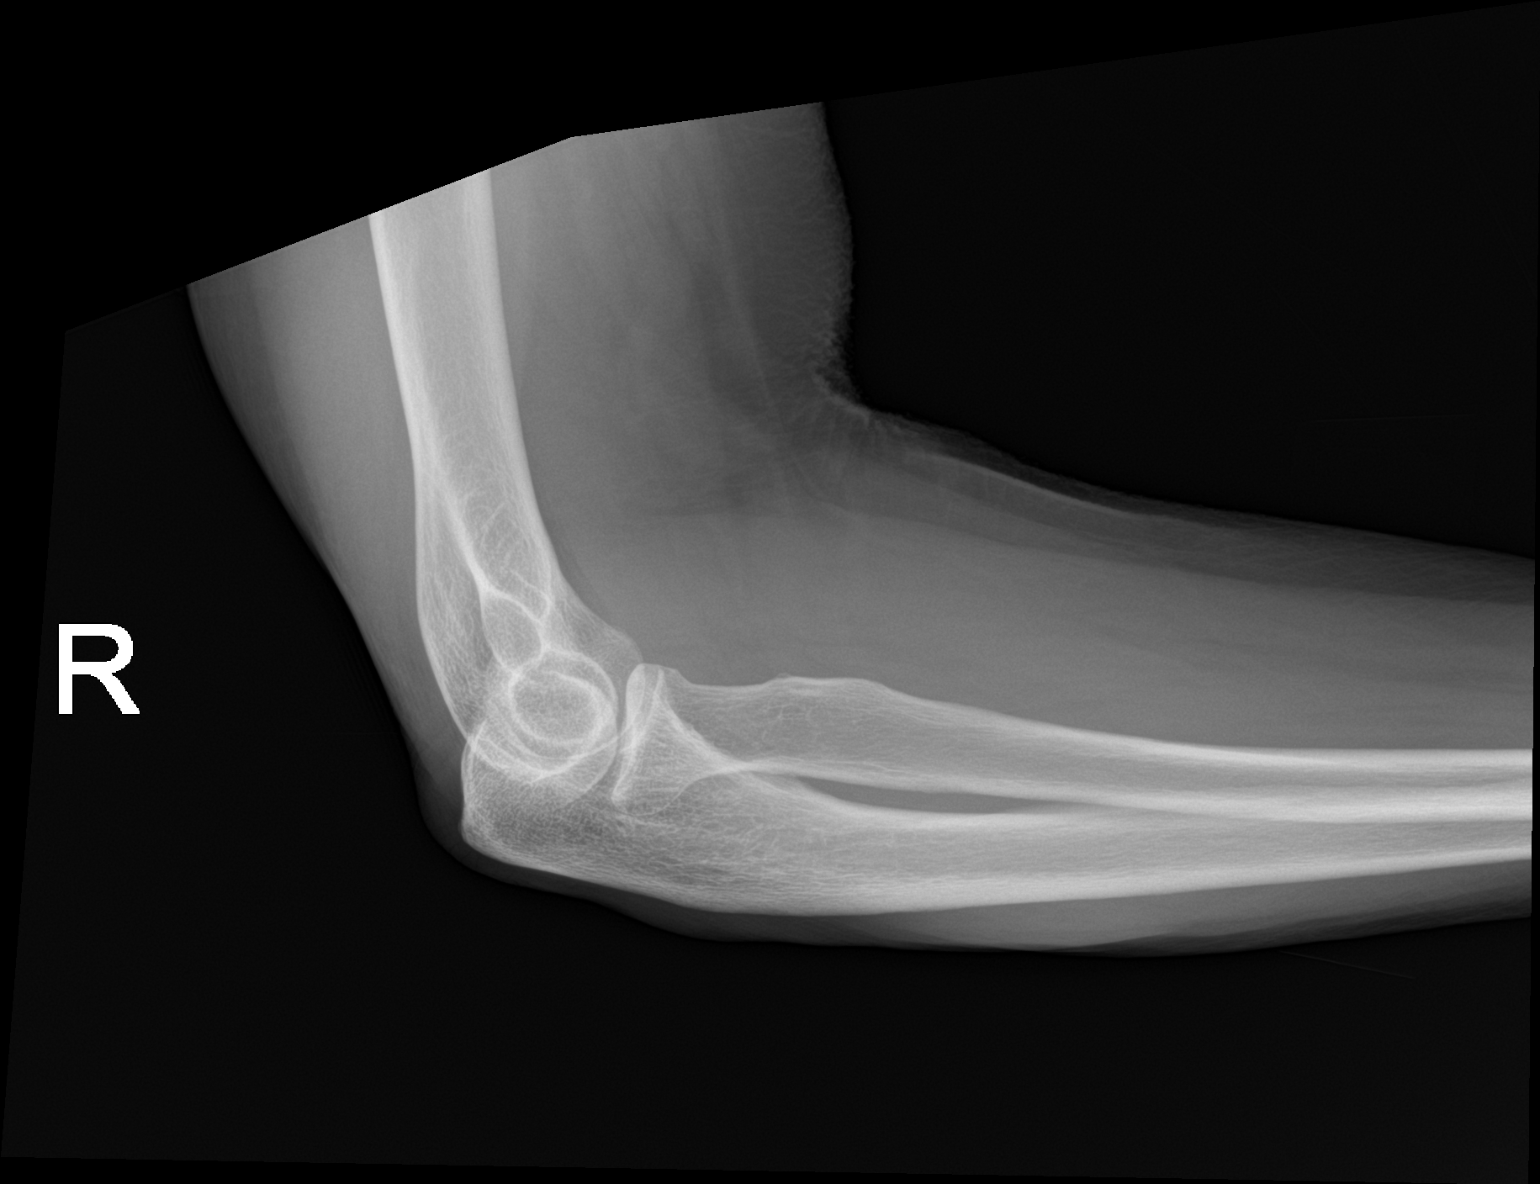

[2 of 2 positions shown; findings below may reference images not displayed]

FINDINGS: There is no evidence of fracture, dislocation, or joint effusion.
There is no evidence of arthropathy or other focal bone abnormality.
Soft tissues are unremarkable.
IMPRESSION: Negative.
